# Patient Record
Sex: Female | Born: 1990 | Race: White | Hispanic: Yes | Marital: Single | Smoking: Never smoker
Health system: Southern US, Community
[De-identification: ages and names within clinical notes are randomized; demographics above are authoritative.]

## PROBLEM LIST (undated history)

## (undated) DIAGNOSIS — F419 Anxiety disorder, unspecified: Secondary | ICD-10-CM

## (undated) HISTORY — DX: Anxiety disorder, unspecified: F41.9

---

## 2012-03-12 ENCOUNTER — Telehealth: Payer: Self-pay

## 2012-03-23 NOTE — Telephone Encounter (Signed)
done

## 2012-05-22 ENCOUNTER — Ambulatory Visit: Payer: Self-pay | Admitting: Physician Assistant

## 2012-05-22 VITALS — BP 111/76 | HR 96 | Temp 98.5°F | Resp 16 | Ht 61.5 in | Wt 96.8 lb

## 2012-05-22 DIAGNOSIS — N390 Urinary tract infection, site not specified: Secondary | ICD-10-CM

## 2012-05-22 DIAGNOSIS — R109 Unspecified abdominal pain: Secondary | ICD-10-CM

## 2012-05-22 DIAGNOSIS — B373 Candidiasis of vulva and vagina: Secondary | ICD-10-CM

## 2012-05-22 DIAGNOSIS — R350 Frequency of micturition: Secondary | ICD-10-CM

## 2012-05-22 LAB — POCT UA - MICROSCOPIC ONLY
Casts, Ur, LPF, POC: NEGATIVE
Crystals, Ur, HPF, POC: NEGATIVE
Yeast, UA: NEGATIVE

## 2012-05-22 LAB — POCT URINALYSIS DIPSTICK
Glucose, UA: NEGATIVE
Nitrite, UA: NEGATIVE
Protein, UA: 100
Spec Grav, UA: >=1.03
Urobilinogen, UA: 0.2
pH, UA: 5.5

## 2012-05-22 MED ORDER — PHENAZOPYRIDINE HCL 200 MG PO TABS: 200.0000 mg | ORAL_TABLET | Freq: Three times a day (TID) | ORAL | Status: DC | PRN

## 2012-05-22 MED ORDER — FLUCONAZOLE 150 MG PO TABS: 150.0000 mg | ORAL_TABLET | Freq: Once | ORAL | Status: DC

## 2012-05-22 MED ORDER — NITROFURANTOIN MONOHYD MACRO 100 MG PO CAPS: 100.0000 mg | ORAL_CAPSULE | Freq: Two times a day (BID) | ORAL | Status: DC

## 2012-05-22 NOTE — Progress Notes (Signed)
Subjective:    Patient ID: Krystal Moreno, female    DOB: [birth year], 21 y.o.   MRN: [id number]  HPI   Krystal Moreno is a 21 yr old female here because "it hurts when I pee".  Did note some blood in her urine, but wasn't sure if this could be here period starting.  Has had some lower abdominal pain, but again wasn't sure if this was due to her period coming.  Denies nausea, vomiting, fever, or chills.  No back pain.  No vaginal discharge.  No concern for STIs, was tested recently.  Does not remember when her LMP was but is pretty sure it was sometime in November, thinks next period is due to start 12/13    Review of Systems  Constitutional: Negative for fever and chills.  HENT: Negative.   Respiratory: Negative.   Cardiovascular: Negative.   Gastrointestinal: Positive for abdominal pain.  Genitourinary: Positive for dysuria, frequency and hematuria. Negative for vaginal bleeding and vaginal discharge.  Musculoskeletal: Negative.   Neurological: Negative.        Objective:   Physical Exam  Vitals reviewed. Constitutional: She is oriented to person, place, and time. She appears well-developed and well-nourished. No distress.  HENT:  Head: Normocephalic and atraumatic.  Cardiovascular: Normal rate, regular rhythm and S1 normal.  Exam reveals no gallop and no friction rub.   No murmur heard.      Split S2  Pulmonary/Chest: Effort normal and breath sounds normal. She has no wheezes. She has no rhonchi. She has no rales.  Abdominal: Soft. Bowel sounds are normal. There is tenderness in the suprapubic area. There is no CVA tenderness.  Neurological: She is alert and oriented to person, place, and time.  Skin: Skin is warm and dry.  Psychiatric: She has a normal mood and affect. Her behavior is normal.      Filed Vitals:   05/22/12 1143  BP: 111/76  Pulse: 96  Temp: 98.5 F (36.9 C)  Resp: 16     Results for orders placed in visit on 05/22/12  POCT UA - MICROSCOPIC  ONLY      Component Value Range   WBC, Ur, HPF, POC TNTC     RBC, urine, microscopic 10-15     Bacteria, U Microscopic 1+     Mucus, UA 1+     Epithelial cells, urine per micros 2-4     Crystals, Ur, HPF, POC neg     Casts, Ur, LPF, POC neg     Yeast, UA neg    POCT URINALYSIS DIPSTICK      Component Value Range   Color, UA yellow     Clarity, UA cloudy     Glucose, UA neg     Bilirubin, UA neg     Ketones, UA neg     Spec Grav, UA >=1.030     Blood, UA large     pH, UA 5.5     Protein, UA 100     Urobilinogen, UA 0.2     Nitrite, UA neg     Leukocytes, UA moderate (2+)    POCT URINE PREGNANCY      Component Value Range   Preg Test, Ur Negative         Assessment & Plan:   1. UTI (lower urinary tract infection)  nitrofurantoin, macrocrystal-monohydrate, (MACROBID) 100 MG capsule, phenazopyridine (PYRIDIUM) 200 MG tablet, Urine culture  2. Urinary frequency  POCT UA - Microscopic Only, POCT urinalysis dipstick, POCT urine  pregnancy  3. Abdominal pain  POCT UA - Microscopic Only, POCT urinalysis dipstick, POCT urine pregnancy  4. Vaginal candidiasis  fluconazole (DIFLUCAN) 150 MG tablet    Krystal Moreno is a 21 yr old female with UTI.  Will treat with Macrobid x 5 days and send urine culture.  Pyridium for symptoms.  One dose given here in the office.  Encouraged plenty of water.  Pt will RTC if worsening or not improving.    Physiologically split S2 noted on exam.  Pt is asymptomatic.  Ryan Dunn PA-C auscultated patient's heart as well.

## 2012-05-22 NOTE — Patient Instructions (Addendum)
Take the antibiotic as directed.  Be sure to finish the full course.  You may use pyridium up to 3 times per day for symptoms.  Drink plenty of water.  Let us know if you are worsening or not improving.  I am sending your urine out for a culture as well, I will let you know if we need to make changes to your medication when this is back.  Urinary Tract Infection Urinary tract infections (UTIs) can develop anywhere along your urinary tract. Your urinary tract is your body's drainage system for removing wastes and extra water. Your urinary tract includes two kidneys, two ureters, a bladder, and a urethra. Your kidneys are a pair of bean-shaped organs. Each kidney is about the size of your fist. They are located below your ribs, one on each side of your spine. CAUSES Infections are caused by microbes, which are microscopic organisms, including fungi, viruses, and bacteria. These organisms are so small that they can only be seen through a microscope. Bacteria are the microbes that most commonly cause UTIs. SYMPTOMS  Symptoms of UTIs may vary by age and gender of the patient and by the location of the infection. Symptoms in young women typically include a frequent and intense urge to urinate and a painful, burning feeling in the bladder or urethra during urination. Older women and men are more likely to be tired, shaky, and weak and have muscle aches and abdominal pain. A fever may mean the infection is in your kidneys. Other symptoms of a kidney infection include pain in your back or sides below the ribs, nausea, and vomiting. DIAGNOSIS To diagnose a UTI, your caregiver will ask you about your symptoms. Your caregiver also will ask to provide a urine sample. The urine sample will be tested for bacteria and white blood cells. White blood cells are made by your body to help fight infection. TREATMENT  Typically, UTIs can be treated with medication. Because most UTIs are caused by a bacterial infection, they  usually can be treated with the use of antibiotics. The choice of antibiotic and length of treatment depend on your symptoms and the type of bacteria causing your infection. HOME CARE INSTRUCTIONS  If you were prescribed antibiotics, take them exactly as your caregiver instructs you. Finish the medication even if you feel better after you have only taken some of the medication.  Drink enough water and fluids to keep your urine clear or pale yellow.  Avoid caffeine, tea, and carbonated beverages. They tend to irritate your bladder.  Empty your bladder often. Avoid holding urine for long periods of time.  Empty your bladder before and after sexual intercourse.  After a bowel movement, women should cleanse from front to back. Use each tissue only once. SEEK MEDICAL CARE IF:   You have back pain.  You develop a fever.  Your symptoms do not begin to resolve within 3 days. SEEK IMMEDIATE MEDICAL CARE IF:   You have severe back pain or lower abdominal pain.  You develop chills.  You have nausea or vomiting.  You have continued burning or discomfort with urination. MAKE SURE YOU:   Understand these instructions.  Will watch your condition.  Will get help right away if you are not doing well or get worse. Document Released: 03/09/2005 Document Revised: 11/29/2011 Document Reviewed: 07/08/2011 [facility] Patient Information 2013 [place], [place].

## 2012-05-24 LAB — URINE CULTURE: Colony Count: 9000

## 2013-01-31 ENCOUNTER — Ambulatory Visit: Payer: Self-pay | Admitting: Physician Assistant

## 2013-01-31 VITALS — BP 118/74 | HR 87 | Temp 98.0°F | Resp 17 | Ht 61.5 in | Wt 105.0 lb

## 2013-01-31 DIAGNOSIS — N898 Other specified noninflammatory disorders of vagina: Secondary | ICD-10-CM

## 2013-01-31 DIAGNOSIS — R350 Frequency of micturition: Secondary | ICD-10-CM

## 2013-01-31 DIAGNOSIS — N390 Urinary tract infection, site not specified: Secondary | ICD-10-CM

## 2013-01-31 LAB — POCT UA - MICROSCOPIC ONLY
Casts, Ur, LPF, POC: NEGATIVE
Crystals, Ur, HPF, POC: NEGATIVE
Mucus, UA: POSITIVE
Yeast, UA: NEGATIVE

## 2013-01-31 LAB — POCT URINALYSIS DIPSTICK
Bilirubin, UA: NEGATIVE
Glucose, UA: NEGATIVE
Ketones, UA: NEGATIVE
Nitrite, UA: NEGATIVE
Protein, UA: 30
Spec Grav, UA: 1.025
Urobilinogen, UA: 0.2
pH, UA: 7.5

## 2013-01-31 LAB — POCT WET PREP WITH KOH
KOH Prep POC: NEGATIVE
Trichomonas, UA: NEGATIVE
Yeast Wet Prep HPF POC: NEGATIVE

## 2013-01-31 MED ORDER — CIPROFLOXACIN HCL 500 MG PO TABS: 500.0000 mg | ORAL_TABLET | Freq: Two times a day (BID) | ORAL | Status: DC

## 2013-01-31 NOTE — Progress Notes (Signed)
Patient ID: Krystal Moreno MRN: [id number], DOB: [birth year], 22 y.o. Date of Encounter: 01/31/2013, 12:38 PM  Primary Physician: No PCP Per Patient  Chief Complaint: urinary frequency and dysuria  HPI: 22 y.o. year old female with presents with 6 day history of urinary frequency, dysuria, and suprapubic pressure. Denies flank pain, nausea, vomiting, fever, chills, vaginal discharge, or odor. Has noticed some vaginal pruritis that she thinks could be a yeast infection. She had several days of antibiotic 2 weeks ago for a sore throat.  Has tried 1 dose of Azo. LNMP 01/30/13. She has no concerns about STI's and does not wish to be tested today.   Patient is otherwise doing well without issues or complaints.  Past Medical History  Diagnosis Date  . Anxiety      Home Meds: Prior to Admission medications   Not on File    Allergies: No Known Allergies  History   Social History  . Marital Status: Single    Spouse Name: N/A    Number of Children: N/A  . Years of Education: N/A   Occupational History  . Not on file.   Social History Main Topics  . Smoking status: Never Smoker   . Smokeless tobacco: Not on file  . Alcohol Use: No  . Drug Use: No  . Sexual Activity: Yes    Birth Control/ Protection: None   Other Topics Concern  . Not on file   Social History Narrative  . No narrative on file     Review of Systems: Constitutional: negative for chills, fever, night sweats, weight changes, or fatigue  HEENT: negative for vision changes, hearing loss, congestion, rhinorrhea, ST, epistaxis, or sinus pressure Cardiovascular: negative for chest pain or palpitations Respiratory: negative for cough, hemoptysis, wheezing, shortness of breath. Abdominal: positive for suprapubic abdominal pain,   No nausea, vomiting, diarrhea, or constipation Genitourinary: positive for urinary frequency and dysuria. Negative for vaginal discharge, odor, or pelvic pain.  Dermatological: negative  for rashes. Neurologic: negative for headache, dizziness, or syncope   Physical Exam: Blood pressure 118/74, pulse 87, temperature 98 F (36.7 C), temperature source Oral, resp. rate 17, height 5' 1.5" (1.562 m), weight 105 lb (47.628 kg), last menstrual period 01/30/2013, SpO2 98.00%., Body mass index is 19.52 kg/(m^2). General: Well developed, well nourished, in no acute distress. Head: Normocephalic, atraumatic, eyes without discharge, sclera non-icteric, nares are without discharge. External ear normal in appearance. Neck: Supple. No thyromegaly. Full ROM. No lymphadenopathy. Lungs: Clear bilaterally to auscultation without wheezes, rales, or rhonchi. Breathing is unlabored. Heart: RRR with S1 S2. No murmurs, rubs, or gallops appreciated. Abdominal: +BS x 4. No hepatosplenomegaly, rebound tenderness, or guarding. Positive suprapubic tenderness. No CVA tenderness bilaterally.  Msk:  Strength and tone normal for age. Extremities/Skin: Warm and dry. No clubbing or cyanosis. No edema.  Neuro: Alert and oriented X 3. Moves all extremities spontaneously. Gait is normal. CNII-XII grossly in tact. Psych:  Responds to questions appropriately with a normal affect.   Labs: Results for orders placed in visit on 01/31/13  POCT UA - MICROSCOPIC ONLY      Result Value Range   WBC, Ur, HPF, POC TNTC     RBC, urine, microscopic 6-8     Bacteria, U Microscopic 2+     Mucus, UA pos     Epithelial cells, urine per micros 3-5     Crystals, Ur, HPF, POC neg     Casts, Ur, LPF, POC neg     Yeast,  UA neg    POCT URINALYSIS DIPSTICK      Result Value Range   Color, UA yellow     Clarity, UA cloudy     Glucose, UA neg     Bilirubin, UA neg     Ketones, UA neg     Spec Grav, UA 1.025     Blood, UA moderate     pH, UA 7.5     Protein, UA 30     Urobilinogen, UA 0.2     Nitrite, UA neg     Leukocytes, UA large (3+)    POCT WET PREP WITH KOH      Result Value Range   Trichomonas, UA Negative      Clue Cells Wet Prep HPF POC 2-4     Epithelial Wet Prep HPF POC 4-8     Yeast Wet Prep HPF POC neg     Bacteria Wet Prep HPF POC 1+     RBC Wet Prep HPF POC 4-6     WBC Wet Prep HPF POC 3-8     KOH Prep POC Negative       ASSESSMENT AND PLAN:  22 y.o. year old female with urinary tract infection Urine culture sent Increase fluids Cipro 500 mg bid x 5 days Azo as needed for symptomatic relief Follow up if symptoms worsen or fail to improve.  Grier Mitts, PA-C 01/31/2013 12:38 PM

## 2013-09-04 ENCOUNTER — Ambulatory Visit: Payer: Self-pay | Admitting: Physician Assistant

## 2013-09-04 VITALS — BP 100/76 | HR 78 | Temp 98.1°F | Resp 18 | Ht 62.0 in | Wt 105.6 lb

## 2013-09-04 DIAGNOSIS — K5900 Constipation, unspecified: Secondary | ICD-10-CM

## 2013-09-04 DIAGNOSIS — R300 Dysuria: Secondary | ICD-10-CM

## 2013-09-04 DIAGNOSIS — R350 Frequency of micturition: Secondary | ICD-10-CM

## 2013-09-04 DIAGNOSIS — N390 Urinary tract infection, site not specified: Secondary | ICD-10-CM

## 2013-09-04 LAB — POCT URINALYSIS DIPSTICK
Bilirubin, UA: NEGATIVE
Glucose, UA: NEGATIVE
Ketones, UA: NEGATIVE
Nitrite, UA: NEGATIVE
PH UA: 5.5
PROTEIN UA: NEGATIVE
Spec Grav, UA: >=1.03
UROBILINOGEN UA: 0.2

## 2013-09-04 LAB — POCT UA - MICROSCOPIC ONLY
CASTS, UR, LPF, POC: NEGATIVE
Crystals, Ur, HPF, POC: NEGATIVE
Mucus, UA: POSITIVE
YEAST UA: NEGATIVE

## 2013-09-04 LAB — POCT URINE PREGNANCY: Preg Test, Ur: NEGATIVE

## 2013-09-04 MED ORDER — NITROFURANTOIN MONOHYD MACRO 100 MG PO CAPS: 100.0000 mg | ORAL_CAPSULE | Freq: Two times a day (BID) | ORAL | Status: DC

## 2013-09-04 NOTE — Patient Instructions (Signed)
-  Take a capfull of Miralax daily for your bowels with plenty of water.  -Take your antibiotic twice daily until done. -Drink plenty of water. -Come back in if your period has not come on in a week.  Urinary Tract Infection Urinary tract infections (UTIs) can develop anywhere along your urinary tract. Your urinary tract is your body's drainage system for removing wastes and extra water. Your urinary tract includes two kidneys, two ureters, a bladder, and a urethra. Your kidneys are a pair of bean-shaped organs. Each kidney is about the size of your fist. They are located below your ribs, one on each side of your spine. CAUSES Infections are caused by microbes, which are microscopic organisms, including fungi, viruses, and bacteria. These organisms are so small that they can only be seen through a microscope. Bacteria are the microbes that most commonly cause UTIs. SYMPTOMS  Symptoms of UTIs may vary by age and gender of the patient and by the location of the infection. Symptoms in young women typically include a frequent and intense urge to urinate and a painful, burning feeling in the bladder or urethra during urination. Older women and men are more likely to be tired, shaky, and weak and have muscle aches and abdominal pain. A fever may mean the infection is in your kidneys. Other symptoms of a kidney infection include pain in your back or sides below the ribs, nausea, and vomiting. DIAGNOSIS To diagnose a UTI, your caregiver will ask you about your symptoms. Your caregiver also will ask to provide a urine sample. The urine sample will be tested for bacteria and white blood cells. White blood cells are made by your body to help fight infection. TREATMENT  Typically, UTIs can be treated with medication. Because most UTIs are caused by a bacterial infection, they usually can be treated with the use of antibiotics. The choice of antibiotic and length of treatment depend on your symptoms and the type of  bacteria causing your infection. HOME CARE INSTRUCTIONS  If you were prescribed antibiotics, take them exactly as your caregiver instructs you. Finish the medication even if you feel better after you have only taken some of the medication.  Drink enough water and fluids to keep your urine clear or pale yellow.  Avoid caffeine, tea, and carbonated beverages. They tend to irritate your bladder.  Empty your bladder often. Avoid holding urine for long periods of time.  Empty your bladder before and after sexual intercourse.  After a bowel movement, women should cleanse from front to back. Use each tissue only once. SEEK MEDICAL CARE IF:   You have back pain.  You develop a fever.  Your symptoms do not begin to resolve within 3 days. SEEK IMMEDIATE MEDICAL CARE IF:   You have severe back pain or lower abdominal pain.  You develop chills.  You have nausea or vomiting.  You have continued burning or discomfort with urination. MAKE SURE YOU:   Understand these instructions.  Will watch your condition.  Will get help right away if you are not doing well or get worse. Document Released: 03/09/2005 Document Revised: 11/29/2011 Document Reviewed: 07/08/2011 [facility] Patient Information 2014 [place], [place].

## 2013-09-04 NOTE — Progress Notes (Signed)
Subjective:    Patient ID: Krystal ShieldsAna Scholz-Montoya, female    DOB: 09/25/1990, 23 y.o.   MRN: [id number]  HPI Primary Physician: No PCP Per Patient  Chief Complaint: Dysuria, urinary frequency and urinary urgency x 1 week   HPI: 23 y.o. female with history below presents with a 1 week history of dysuria, urinary frequency, and urinary urgency. Afebrile. No chills. No abdominal pain, but some cramping. No flank pain or LBP. Some nausea, no vomiting.  1 day late for her period. This can be usual for her though. Having some cramping. Always cramps before her period. Does have some nausea associated with her period onset. Currently sexually active. Uses condoms, but not everytime. Took Plan B last month. No history of STDs. In current relationship for 3 years.   Will go 1-2 days without BM. Does not drink much water or each much fiber.    Past Medical History  Diagnosis Date  . Anxiety      Home Meds: Prior to Admission medications   Not on File    Allergies: No Known Allergies  History   Social History  . Marital Status: Single    Spouse Name: N/A    Number of Children: N/A  . Years of Education: N/A   Occupational History  . Not on file.   Social History Main Topics  . Smoking status: Never Smoker   . Smokeless tobacco: Not on file  . Alcohol Use: No  . Drug Use: No  . Sexual Activity: Yes    Birth Control/ Protection: None   Other Topics Concern  . Not on file   Social History Narrative  . No narrative on file     Review of Systems  Constitutional: Negative for fever, chills and fatigue.  Gastrointestinal: Positive for nausea. Negative for vomiting.  Genitourinary: Positive for dysuria, urgency and frequency. Negative for hematuria, flank pain, decreased urine volume, vaginal bleeding, vaginal discharge, enuresis, difficulty urinating, genital sores, vaginal pain, menstrual problem, pelvic pain and dyspareunia.  Musculoskeletal: Positive for back pain. Negative for  myalgias.       Also associated with period at baseline.   Skin: Negative for rash.  Psychiatric/Behavioral: Negative for hallucinations, behavioral problems, confusion, dysphoric mood, decreased concentration and agitation. The patient is not nervous/anxious and is not hyperactive.        Objective:   Physical Exam  Physical Exam: Blood pressure 100/76, pulse 78, temperature 98.1 F (36.7 C), temperature source Oral, resp. rate 18, height 5\' 2"  (1.575 m), weight 105 lb 9.6 oz (47.9 kg), last menstrual period 08/06/2013, SpO2 99.00%., Body mass index is 19.31 kg/(m^2). General: Well developed, well nourished, in no acute distress. Head: Normocephalic, atraumatic, eyes without discharge, sclera non-icteric, nares are without discharge. Bilateral auditory canals clear, TM's are without perforation, pearly grey and translucent with reflective cone of light bilaterally. Oral cavity moist, posterior pharynx without exudate, erythema, peritonsillar abscess, or post nasal drip. Uvula midline.   Neck: Supple. No thyromegaly. Full ROM. No lymphadenopathy. No nuchal rigidity.  Lungs: Clear bilaterally to auscultation without wheezes, rales, or rhonchi. Breathing is unlabored. Heart: RRR with S1 S2. No murmurs, rubs, or gallops appreciated. Abdomen: Soft, non-tender, non-distended with normoactive bowel sounds. Palpation over the bladder creates sensation to void. No hepatosplenomegaly. No rebound/guarding. No obvious abdominal masses. No CVA TTP bilaterally.  Msk:  Strength and tone normal for age. Extremities/Skin: Warm and dry. No clubbing or cyanosis. No edema. No rashes or suspicious lesions. Neuro: Alert and oriented  X 3. Moves all extremities spontaneously. Gait is normal. CNII-XII grossly in tact. Psych:  Responds to questions appropriately with a normal affect.   Labs: Results for orders placed in visit on 09/04/13  POCT URINALYSIS DIPSTICK      Result Value Ref Range   Color, UA yellow       Clarity, UA cloudy     Glucose, UA neg     Bilirubin, UA neg     Ketones, UA neg     Spec Grav, UA >=1.030     Blood, UA small     pH, UA 5.5     Protein, UA neg     Urobilinogen, UA 0.2     Nitrite, UA neg     Leukocytes, UA small (1+)    POCT UA - MICROSCOPIC ONLY      Result Value Ref Range   WBC, Ur, HPF, POC TNTC     RBC, urine, microscopic 3-5     Bacteria, U Microscopic trace     Mucus, UA positive     Epithelial cells, urine per micros TNTC     Crystals, Ur, HPF, POC neg     Casts, Ur, LPF, POC neg     Yeast, UA neg    POCT URINE PREGNANCY      Result Value Ref Range   Preg Test, Ur Negative      Urine culture pending     Assessment & Plan:  23 year old female with UTI and constipation  1) UTI -Macrobid 100 mg 1 po bid #14 no RF -Push fluids -Await urine culture results -If period does not come on within the next week RTC -RTC precautions  2) Constipation -Miralax -Increase fiber -Increase water   Eula Listen, MHS, PA-C [facility] [address] [place], [place] [address] [phone] [facility] 09/04/2013 11:37 AM

## 2013-09-06 LAB — URINE CULTURE

## 2014-04-17 ENCOUNTER — Emergency Department (HOSPITAL_COMMUNITY)
Admission: EM | Admit: 2014-04-17 | Discharge: 2014-04-17 | Disposition: A | Discharge status: Home / Self Care | Payer: No Typology Code available for payment source | Attending: Emergency Medicine | Admitting: Emergency Medicine

## 2014-04-17 ENCOUNTER — Emergency Department (HOSPITAL_COMMUNITY): Payer: No Typology Code available for payment source

## 2014-04-17 ENCOUNTER — Encounter (HOSPITAL_COMMUNITY): Payer: Self-pay | Admitting: Emergency Medicine

## 2014-04-17 DIAGNOSIS — S3992XA Unspecified injury of lower back, initial encounter: Secondary | ICD-10-CM | POA: Insufficient documentation

## 2014-04-17 DIAGNOSIS — Z792 Long term (current) use of antibiotics: Secondary | ICD-10-CM | POA: Diagnosis not present

## 2014-04-17 DIAGNOSIS — Z8659 Personal history of other mental and behavioral disorders: Secondary | ICD-10-CM | POA: Insufficient documentation

## 2014-04-17 DIAGNOSIS — Y9389 Activity, other specified: Secondary | ICD-10-CM | POA: Diagnosis not present

## 2014-04-17 DIAGNOSIS — Y92410 Unspecified street and highway as the place of occurrence of the external cause: Secondary | ICD-10-CM | POA: Insufficient documentation

## 2014-04-17 DIAGNOSIS — M5489 Other dorsalgia: Secondary | ICD-10-CM

## 2014-04-17 MED ORDER — IBUPROFEN 800 MG PO TABS
800.0000 mg | ORAL_TABLET | Freq: Once | ORAL | Status: AC
  Administered 2014-04-17: 800 mg via ORAL
  Filled 2014-04-17: qty 1

## 2014-04-17 MED ORDER — IBUPROFEN 800 MG PO TABS: 800.0000 mg | ORAL_TABLET | Freq: Three times a day (TID) | ORAL | Status: DC

## 2014-04-17 NOTE — Discharge Instructions (Signed)
Ejercicios para la espalda (Back Exercises) Estos ejercicios lo ayudarn al comienzo de la rehabilitacin. Los sntomas podrn aliviarse con o sin asistencia adicional de su mdico, fisioterapeuta o Herbalistentrenador. Al completar estos ejercicios, recuerde:   Restaurar la flexibilidad del tejido ayuda a que las articulaciones recuperen el movimiento normal. Esto permite que el movimiento y la actividad sean ms saludables y menos dolorosos.  Para que sea efectiva, cada elongacin debe realizarse durante al menos 30 segundos.  La elongacin nunca debe ser dolorosa. Deber sentir slo un alargamiento o distensin suave del tejido que estira. ELONGACIN Extensin posicin prona United Stationerssobre los codos  Acustese boca abajo sobre el piso, una cama ser muy blanda. Coloque las palmas a una distancia igual al ancho de los hombros y a la altura de la cabeza.  Coloque los codos bajo los hombros. Si siente dolor, colquese almohadas debajo del pecho.  Deje que su cuerpo se relaje, de modo que las caderas queden ms abajo y tengan ms contacto con el piso.  Mantenga esta posicin durante __________ segundos.  Vuelva lentamente a la posicin plana sobre el piso. Reptalo __________ veces. Realice este ejercicio __________ veces por da.  AMPLITUD DE MOVIMIENTOS - Extensin - flexin de brazos en posicin prona  Acustese boca abajo Charles Schwabsobre el piso, una cama ser Otter Lakemuy blanda. Coloque las palmas a una distancia igual al ancho de los hombros y a la altura de la cabeza.  Mantenga la espalda tan relajada como pueda, enderece lentamente los codos [address] Flo[address] mantiene las caderas contra el suelo. Puede modificar la posicin de las manos para estar ms cmodo. A medida que gana movimiento, sus manos quedarn ms por debajo de los hombros.  Mantenga cada posicin durante __________ segundos.  Vuelva lentamente a la posicin plana sobre el piso. Reptalo __________ veces. Realice este ejercicio __________ veces por da.    AMPLITUD DE MOVIMIENTOS Cuadrpedo [place] vertebral neutral  Coloque las manos y las rodillas en una superficie firme. Las manos deben quedar a la altura de los hombros y las rodillas debajo de las caderas. Puede colocar algo debajo las rodillas para estar ms cmodo.  Deje caer la cabeza y apunte el cccix hacia el piso. De este modo se redondear la cintura, en Clif[place] Gardensuna postura similar a un gato enojado. Mantenga esta posicin durante __________ segundos.  Levante lentamente la cabeza y relaje el cccix, de modo que su espalda forme un gran arco, como si fuera un caballo viejo.  Mantenga esta posicin durante __________ segundos.  Reptalo hasta sentir calor en la cintura.  Ahora encuentre su "punto ideal". Ser la posicin ms cmoda Dollar Generalentre las dos posiciones anteriores. En esta posicin es cuando su columna est neutral. Una vez que encuentre la posicin, tensione los msculos del estmago para sostener la zona inferior de la espalda.  Mantenga esta posicin durante __________ segundos. Reptalo __________ veces. Realice este ejercicio __________ veces por da.  ELONGACION Flexin - una rodilla al pecho   Recustese en una cama dura o sobre el piso, con ambas piernas extendidas al frente.  Manteniendo una pierna en contacto con el piso, lleve la rodilla opuesta al pecho. Mantenga la pierna en esa posicin, sostenindola por la zona posterior del muslo o por la rodilla.  Presione hasta sentir un suave estiramiento en la cintura. Mantenga esta posicin durante __________ segundos.  Libere la pierna lentamente y repita el ejercicio con el lado opuesto. Reptalo __________ veces. Realice este ejercicio __________ veces por da.  ELONGACIN Tendones de pie  OwingsvilleEstando de  pie o sentado, extienda la pierna derecha / izquierdo, colocando el pie en una silla o banco.  Doble ligeramente la espalda y las caderas, formando un arco suave hacia adelante.  Lleve el pecho hacia adelante hasta sentir un  ligero estiramiento en la zona posterior de la rodilla o muslo derecha / izquierdo. (Si se realiza correctamente, este ejercicio requiere inclinarse muy poco hacia adelante).  Mantenga esta posicin durante __________ segundos. Reptalo __________ veces. Realice este estiramiento __________ Anthoney Harada por da. FORTALECIMIENTO - Abdominales profundos - Inclinacin plvica  Recustese sobre una cama firme o sobre el suelo. Mantenga las piernas al frente, doble las rodillas de modo que ambas apunten hacia el techo y los pies queden bien apoyados en el piso.  Tensione la zona baja de los msculos abdominales para presionar la Oncologist. Este movimiento har rotar su pelvis de modo que el cccix quede hacia arriba y no apuntando a los pies o hacia el piso.  Con una tensin suave y respiracin pareja, mantenga esta posicin durante __________ segundos. Reptalo __________ veces. Realice este ejercicio __________ veces por da.  FORTALECIMIENTO Abdominales encogimiento abdominal.  Recustese sobre una cama firme o sobre el suelo. Mantenga las piernas al frente, doble las rodillas de modo que ambas apunten hacia el techo y los pies queden bien apoyados en el piso. Cruce las Google.  Apunte suavemente con la barbilla hacia abajo, sin doblar el cuello.  Tensione los abdominales y eleve lentamente el tronco la altura suficiente para despegar los omplatos. Si se eleva ms, pondr tensin excesiva en la cintura y esto no fortalecer ms los abdominales.  Controle la vuelta a la posicin inicial. Reptalo __________ veces. Realice este ejercicio __________ veces por da.  EN CUATRO MIEMBROS - Elevacin de miembro superior e inferior opuestos  [employer] y las rodillas en una superficie firme. Las manos deben quedar a la altura de los hombros y las rodillas debajo de las caderas. Puede colocar algo debajo las rodillas para estar ms cmodo.  Encuentre la posicin neutral de  la columna vertebral y Public house manager los msculos abdominales de modo que pueda mantener esta posicin. Los hombros y las caderas deben formar un rectngulo paralelo con el suelo y sin curvas.  Manteniendo el tronco firme, eleve la mano derecha a la altura del hombro y luego eleve la pierna izquierda a la altura de la cadera. Asegrese de no contener la respiracin. Mantenga cada posicin durante __________ segundos.  Con los msculos abdominales en tensin y la espalda firme, vuelva lentamente a la posicin inicial. Repita con el otro brazo y la otra pierna. Reptalo __________ veces. Realice este ejercicio __________ veces por da. Document Released: 11/21/2005 Document Revised: 08/22/2011 [facility] Patient Information 2015 [place], [place]. This information is not intended to replace advice given to you by your health care provider. Make sure you discuss any questions you have with your health care provider.  Dolor de espalda en el adulto (Back Pain, Adult)  El dolor de cintura es frecuente. Aproximadamente 1 de cada 5 personas lo sufren.La causa rara vez pone en peligro la vida. Con frecuencia mejora luego de algn tiempo.Alrededor de la mitad de las personas que sufren un inicio sbito de dolor de cintura, se sentirn mejor luego de 2 semanas. Aproximadamente 8 de cada 10 se sentirn mejor luego de 6 semanas.  CAUSAS  Algunas causas comunes son:   Distensin de los msculos o ligamentos que sostienen la columna vertebral.  Desgaste (degeneracin)  de los discos vertebrales.  Artritis.  Traumatismos directos en la espalda. DIAGNSTICO  La mayor parte de las veces, la causa directa no se conoce.Sin embargo, Chief Technology Officerel dolor puede tratarse efectivamente an cuando no se Best boyconozca la causa.Una de las formas ms precisas de asegurar que la causa del dolor no constituye un peligro es responder a las preguntas del mdico acerca de su salud y sus sntomas. Si el mdico necesita ms informacin,  podr indicar anlisis de laboratorio o Education officer, environmentalrealizar un diagnstico por imgenes (radiografas o Health visitorresonancia magntica).Sin embargo, aunque las Hovnanian Enterprisesimgenes muestren modificaciones, generalmente no es necesaria la Azerbaijanciruga.  INSTRUCCIONES PARA EL CUIDADO EN EL HOGAR  En algunas personas, el dolor de espalda vuelve.Como rara vez es peligroso, los pacientes pueden aprender a Interior and spatial designermanejarlo ellos mismos.   Mantngase activo. Si permanece sentado o de pie mucho tiempo en el mismo lugar, se tensiona la espalda.  No se siente, maneje ni se quede parado en un mismo lugar por ms de 30 minutos. Realice caminatas cortas en superficies planas ni bien el dolor haya cedido. Trate de Copyaumentar cada da el tiempo que camina .  No se quede en la cama.Si hace reposo durante ms de 1 o 2 das, puede Estate agentretrasar la recuperacin.  No evite los ejercicios ni el trabajo.El cuerpo est hecho para moverse.No es peligroso estar [place]activo, aunque le duela la espalda.La espalda se curar ms rpido si contina sus actividades antes de que el dolor se vaya.  Preste atencin a su cuerpo cuando se incline y se levante. Muchas personas sienten menos molestias cuando levantan objetos si doblan las rodillas, mantienen la carga cerca del cuerpo y evitan torcerse. Generalmente, las posiciones ms cmodas son las que ejercen menos tensin en la espalda en recuperacin.  Encuentre una posicin cmoda para dormir. Utilice un colchn firme y recustese de Palcocostado. Doble ligeramente sus rodillas. Si se recuesta sobre su espalda, coloque una almohada debajo de sus rodillas.  Tome slo medicamentos de venta libre o recetados, segn las indicaciones del mdico. Los medicamentos de venta libre para Primary school teachercalmar el dolor y reducir Futures traderla inflamacin, son los que en general ms ayudan.El mdico podr prescribirle relajantes musculares.Estos medicamentos calman el dolor de modo que pueda retornar a sus actividades normales y a Investment banker, operationalrealizar ejercicios saludables.  Aplique  hielo sobre la zona lesionada.  Ponga el hielo en una bolsa plstica.  Colquese una toalla entre la piel y la bolsa de hielo.  Deje la bolsa de hielo durante 15 a 20 minutos 3 a 4 veces por da, durante los primeros 2  3 das. Luego podr alternar Eusebio Meentre calor y hielo para reducir Chief Technology Officerel dolor y los espasmos.  Consulte a su mdico si puede tratar de hacer ejercicios para la espalda y recibir un masaje suave. Pueden ser beneficiosos.  Evite sentirse ansioso o estresado.El estrs aumenta la tensin muscular y puede empeorar el dolor de espalda.Es importante reconocer cuando est ansioso o estresado y aprender la forma de controlarlos.El ejercicio es una gran opcin. SOLICITE ATENCIN MDICA SI:   Siente un dolor que no se alivia con reposo o medicamentos.  El dolor no mejora en 1 semana.  Desarrolla nuevos sntomas.  No se siente bien en general. SOLICITE ATENCIN MDICA DE INMEDIATO SI:  Siente un dolor que se irradia desde la espalda hacia sus piernas.  Desarrolla nuevos problemas en el intestino o la vejiga.  Siente debilidad o adormecimiento inusual en sus brazos o piernas.  Presenta nuseas o vmitos.  Presenta dolor abdominal.  Se siente  desfalleciente. Document Released: 05/30/2005 Document Revised: 11/29/2011 [facility] Patient Information 2015 [place], [place]. This information is not intended to replace advice given to you by your health care provider. Make sure you discuss any questions you have with your health care provider.
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
## 2014-06-02 ENCOUNTER — Ambulatory Visit (INDEPENDENT_AMBULATORY_CARE_PROVIDER_SITE_OTHER): Payer: Self-pay | Admitting: Family Medicine

## 2014-06-02 ENCOUNTER — Encounter: Payer: Self-pay | Admitting: Family Medicine

## 2014-06-02 VITALS — BP 116/74 | HR 74 | Temp 99.1°F | Resp 16 | Ht 62.0 in | Wt 103.6 lb

## 2014-06-02 DIAGNOSIS — R300 Dysuria: Secondary | ICD-10-CM

## 2014-06-02 DIAGNOSIS — R350 Frequency of micturition: Secondary | ICD-10-CM

## 2014-06-02 LAB — POCT UA - MICROSCOPIC ONLY
CRYSTALS, UR, HPF, POC: NEGATIVE
Casts, Ur, LPF, POC: NEGATIVE
Mucus, UA: POSITIVE
YEAST UA: NEGATIVE

## 2014-06-02 LAB — POCT URINALYSIS DIPSTICK
BILIRUBIN UA: NEGATIVE
Glucose, UA: NEGATIVE
LEUKOCYTES UA: NEGATIVE
NITRITE UA: NEGATIVE
Spec Grav, UA: >=1.03
Urobilinogen, UA: 0.2
pH, UA: 5

## 2014-06-02 MED ORDER — CIPROFLOXACIN HCL 250 MG PO TABS: 250.0000 mg | ORAL_TABLET | Freq: Two times a day (BID) | ORAL | Status: DC

## 2014-06-02 NOTE — Progress Notes (Signed)
Chief Complaint:  Chief Complaint  Patient presents with  . Dysuria    x 1 week    HPI: Krystal Moreno is a 23 y.o. female who is here for has had dysuria for 1 week , she is also having a cold.  No fevers or chills or nausea or vomiting, no blood in urine or stool renal stone She has no hx of kidney stones, has ahd poor po Has had UTI and feels the same She LMP was 1 week ago She had a pap last year and was normal No vaginal discharge, Has had some back pain  No pelivic pain, she had increase frequency and dysuria.   Past Medical History  Diagnosis Date  . Anxiety    History reviewed. No pertinent past surgical history. History   Social History  . Marital Status: Single    Spouse Name: N/A    Number of Children: N/A  . Years of Education: N/A   Social History Main Topics  . Smoking status: Never Smoker   . Smokeless tobacco: None  . Alcohol Use: No  . Drug Use: No  . Sexual Activity: Yes    Birth Control/ Protection: None   Other Topics Concern  . None   Social History Narrative   Family History  Problem Relation Age of Onset  . Cancer Maternal Grandmother    No Known Allergies Prior to Admission medications   Medication Sig Start Date End Date Taking? Authorizing Provider  ibuprofen (ADVIL,MOTRIN) 800 MG tablet Take 1 tablet (800 mg total) by mouth 3 (three) times daily. Patient not taking: Reported on 06/02/2014 04/17/14   Mora BellmanHannah S Merrell, PA-C  nitrofurantoin, macrocrystal-monohydrate, (MACROBID) 100 MG capsule Take 1 capsule (100 mg total) by mouth 2 (two) times daily. Patient not taking: Reported on 06/02/2014 09/04/13   Raymon Muttonyan M Dunn, PA-C     ROS: The patient denies fevers, chills, night sweats, unintentional weight loss, chest pain, palpitations, wheezing, dyspnea on exertion, nausea, vomiting, abdominal pain,  hematuria, melena, numbness, weakness, or tingling.   All other systems have been reviewed and were otherwise negative with the  exception of those mentioned in the HPI and as above.    PHYSICAL EXAM: Filed Vitals:   06/02/14 0834  BP: 116/74  Pulse: 74  Temp: 99.1 F (37.3 C)  Resp: 16   Filed Vitals:   06/02/14 0834  Height: 5\' 2"  (1.575 m)  Weight: 103 lb 9.6 oz (46.993 kg)   Body mass index is 18.94 kg/(m^2).  General: Alert, no acute distress HEENT:  Normocephalic, atraumatic, oropharynx patent. EOMI, PERRLA Cardiovascular:  Regular rate and rhythm, no rubs murmurs or gallops.  No Carotid bruits, radial pulse intact. No pedal edema.  Respiratory: Clear to auscultation bilaterally.  No wheezes, rales, or rhonchi.  No cyanosis, no use of accessory musculature GI: No organomegaly, abdomen is soft and non-tender, positive bowel sounds.  No masses. Skin: No rashes. Neurologic: Facial musculature symmetric. Psychiatric: Patient is appropriate throughout our interaction. Lymphatic: No cervical lymphadenopathy Musculoskeletal: Gait intact. No CVA tenderness   LABS: Results for orders placed or performed in visit on 06/02/14  POCT UA - Microscopic Only  Result Value Ref Range   WBC, Ur, HPF, POC 6-16    RBC, urine, microscopic 3-16    Bacteria, U Microscopic 1+    Mucus, UA positive    Epithelial cells, urine per micros 1-7    Crystals, Ur, HPF, POC neg    Casts, Ur,  LPF, POC neg    Yeast, UA neg   POCT urinalysis dipstick  Result Value Ref Range   Color, UA yellow    Clarity, UA clear    Glucose, UA neg    Bilirubin, UA neg    Ketones, UA trace    Spec Grav, UA >=1.030    Blood, UA small    pH, UA 5.0    Protein, UA trace    Urobilinogen, UA 0.2    Nitrite, UA neg    Leukocytes, UA Negative      EKG/XRAY:   [facility]   ASSESSMENT/PLAN: Encounter Diagnoses  Name Primary?  . Dysuria Yes  . Increased urinary frequency    Early UTI vs possible renal stones Try AZO otc, push fluids If no improvement then try cipro 250 mg BID x 3 days F/u  prn   Gross sideeffects, risk and benefits, and alternatives of medications d/w patient. Patient is aware that all medications have potential sideeffects and we are unable to predict every sideeffect or drug-drug interaction that may occur.  Hamilton CapriLE, Chidubem Chaires PHUONG, DO 06/02/2014 9:31 AM

## 2014-06-02 NOTE — Patient Instructions (Signed)

## 2014-10-29 ENCOUNTER — Ambulatory Visit (INDEPENDENT_AMBULATORY_CARE_PROVIDER_SITE_OTHER): Payer: Self-pay | Admitting: Family Medicine

## 2014-10-29 VITALS — BP 112/68 | HR 98 | Temp 98.0°F | Resp 17 | Ht 61.5 in | Wt 108.0 lb

## 2014-10-29 DIAGNOSIS — R059 Cough, unspecified: Secondary | ICD-10-CM

## 2014-10-29 DIAGNOSIS — J029 Acute pharyngitis, unspecified: Secondary | ICD-10-CM

## 2014-10-29 DIAGNOSIS — R05 Cough: Secondary | ICD-10-CM

## 2014-10-29 DIAGNOSIS — J069 Acute upper respiratory infection, unspecified: Secondary | ICD-10-CM

## 2014-10-29 NOTE — Progress Notes (Signed)
[facility] [address], [place] [address] [phone]  Date:  10/29/2014   Name:  Krystal Moreno   DOB:  08-Oct-1990   MRN:  [id number]  PCP:  No PCP Per Patient    Chief Complaint: Nasal Congestion; Back Pain; Generalized Body Aches; Chest Pain; Sore Throat; and Cough   History of Present Illness:  Krystal Moreno is a 24 y.o. very pleasant female patient who presents with the following:  Today is Wednesday- on Saturday she noted a ST and horase voice. This is now better, but she notes onset of runny nose, headache, chest soreness, cough, body aches, mucus from her nose and mouth.   She has not noted a fever at home No Gi symptoms.  She is generally in good health At home she has tried some OTC cold preps but no anitpyreitcs today so far  There are no active problems to display for this patient.   Past Medical History  Diagnosis Date  . Anxiety     No past surgical history on file.  History  Substance Use Topics  . Smoking status: Never Smoker   . Smokeless tobacco: Not on file  . Alcohol Use: No    Family History  Problem Relation Age of Onset  . Cancer Maternal Grandmother     No Known Allergies  Medication list has been reviewed and updated.  Current Outpatient Prescriptions on File Prior to Visit  Medication Sig Dispense Refill  . ibuprofen (ADVIL,MOTRIN) 800 MG tablet Take 1 tablet (800 mg total) by mouth 3 (three) times daily. (Patient not taking: Reported on 06/02/2014) 21 tablet 0   No current facility-administered medications on file prior to visit.    Review of Systems:  As per HPI- otherwise negative.   Physical Examination: Filed Vitals:   10/29/14 0837  BP: 112/68  Pulse: 98  Temp: 98 F (36.7 C)  Resp: 17   Filed Vitals:   10/29/14 0837  Height: 5' 1.5" (1.562 m)  Weight: 108 lb (48.988 kg)   Body mass index is 20.08 kg/(m^2). Ideal Body Weight: Weight in (lb) to have BMI = 25: 134.2  GEN:  WDWN, NAD, Non-toxic, A & O x 3, looks well except for congestion HEENT: Atraumatic, Normocephalic. Neck supple. No masses, No LAD.  Bilateral TM wnl, oropharynx normal.  PEERL,EOMI.  Clear fluid behind bilateral TM  Ears and Nose: No external deformity.   CV: RRR, No M/G/R. No JVD. No thrill. No extra heart sounds. PULM: CTA B, no wheezes, crackles, rhonchi. No retractions. No resp. distress. No accessory muscle use. ABD: S, NT, ND No rebound. No HSM. EXTR: No c/c/e NEURO Normal gait.  PSYCH: Normally interactive. Conversant. Not depressed or anxious appearing.  Calm demeanor.    Assessment and Plan: Viral URI  Sore throat  Cough  Treat with supportive measures.  Sample of delsym and cepachol drops.  Follow-up if not better soon  Signed Abbe AmsterdamJessica Wilton Thrall, MD

## 2014-10-29 NOTE — Patient Instructions (Signed)
It appears that you have a bad cold. Rest, drink plenty of fluids.  Try to get some extra sleep! Use the delsym and cough drops as needed Tylenol and/ or ibuprofen will help with aches and fatigue Let me know if you do not feel better in the next few days- Sooner if worse.

## 2016-07-04 ENCOUNTER — Ambulatory Visit (INDEPENDENT_AMBULATORY_CARE_PROVIDER_SITE_OTHER): Payer: BLUE CROSS/BLUE SHIELD | Admitting: Urgent Care

## 2016-07-04 VITALS — BP 100/60 | HR 80 | Temp 98.3°F | Resp 16 | Ht 61.5 in | Wt 122.0 lb

## 2016-07-04 DIAGNOSIS — R300 Dysuria: Secondary | ICD-10-CM

## 2016-07-04 DIAGNOSIS — N3090 Cystitis, unspecified without hematuria: Secondary | ICD-10-CM | POA: Diagnosis not present

## 2016-07-04 DIAGNOSIS — H938X2 Other specified disorders of left ear: Secondary | ICD-10-CM

## 2016-07-04 LAB — POCT URINALYSIS DIP (MANUAL ENTRY)
BILIRUBIN UA: NEGATIVE
Bilirubin, UA: NEGATIVE
Blood, UA: NEGATIVE
Glucose, UA: NEGATIVE
Leukocytes, UA: NEGATIVE
Nitrite, UA: NEGATIVE
PH UA: 7
Spec Grav, UA: 1.025
Urobilinogen, UA: 0.2

## 2016-07-04 LAB — POC MICROSCOPIC URINALYSIS (UMFC)

## 2016-07-04 MED ORDER — NITROFURANTOIN MONOHYD MACRO 100 MG PO CAPS: 100.0000 mg | ORAL_CAPSULE | Freq: Two times a day (BID) | ORAL | 0 refills | Status: DC

## 2016-07-04 NOTE — Patient Instructions (Addendum)
Urinary Tract Infection, Adult Introduction A urinary tract infection (UTI) is an infection of any part of the urinary tract. The urinary tract includes the:  Kidneys.  Ureters.  Bladder.  Urethra. These organs make, store, and get rid of pee (urine) in the body. Follow these instructions at home:  Take over-the-counter and prescription medicines only as told by your doctor.  If you were prescribed an antibiotic medicine, take it as told by your doctor. Do not stop taking the antibiotic even if you start to feel better.  Avoid the following drinks:  Alcohol.  Caffeine.  Tea.  Carbonated drinks.  Drink enough fluid to keep your pee clear or pale yellow.  Keep all follow-up visits as told by your doctor. This is important.  Make sure to:  Empty your bladder often and completely. Do not to hold pee for long periods of time.  Empty your bladder before and after sex.  Wipe from front to back after a bowel movement if you are female. Use each tissue one time when you wipe. Contact a doctor if:  You have back pain.  You have a fever.  You feel sick to your stomach (nauseous).  You throw up (vomit).  Your symptoms do not get better after 3 days.  Your symptoms go away and then come back. Get help right away if:  You have very bad back pain.  You have very bad lower belly (abdominal) pain.  You are throwing up and cannot keep down any medicines or water. This information is not intended to replace advice given to you by your health care provider. Make sure you discuss any questions you have with your health care provider. Document Released: 11/16/2007 Document Revised: 11/05/2015 Document Reviewed: 04/20/2015  2017 Elsevier   For ear popping, fullness, nasal congestion and post-nasal drainage you may use a combination of Zyrtec (10mg  daily) and Sudafed (120mg  twice daily).     IF you received an x-ray today, you will receive an invoice from [facility]. Please contact [facility] at [phone] with questions or concerns regarding your invoice.   IF you received labwork today, you will receive an invoice from [facility]. Please contact [facility] at [phone] with questions or concerns regarding your invoice.   Our billing staff will not be able to assist you with questions regarding bills from these companies.  You will be contacted with the lab results as soon as they are available. The fastest way to get your results is to activate your My Chart account. Instructions are located on the last page of this paperwork. If you have not heard from us regarding the results in 2 weeks, please contact this office.

## 2016-07-04 NOTE — Progress Notes (Signed)
    MRN: [id number] DOB: 12/12/1990  Subjective:   Krystal Moreno is a 26 y.o. female presenting for chief complaint of Urinary Tract Infection (painful urination, took azo, urinary ordor started last night )  Reports 3 day history of dysuria, urinary frequency, urinary urgency, pelvic pain and cloudy malordorous urine, nausea. Has tried Azo, ibuprofen relief. Denies fever, hematuria, flank pain, abdominal pain, genital rash and vaginal discharge, vomiting. Patient drinks green tea regularly. Does not hydrate well. Denies smoking cigarettes.  Darien Ramusna is not currently taking any medications. Patient has No Known Allergies.  Darien Ramusna  has a past medical history of Anxiety. Also denies past surgical history.  Objective:   Vitals: BP 100/60 (BP Location: Right Arm, Patient Position: Sitting, Cuff Size: Small)   Pulse 80   Temp 98.3 F (36.8 C) (Oral)   Resp 16   Ht 5' 1.5" (1.562 m)   Wt 122 lb (55.3 kg)   LMP 06/22/2016   SpO2 99%   BMI 22.68 kg/m   Physical Exam  Constitutional: She is oriented to person, place, and time. She appears well-developed and well-nourished.  HENT:  TM's intact bilaterally, no effusions or erythema.  Cardiovascular: Normal rate, regular rhythm and intact distal pulses.  Exam reveals no gallop and no friction rub.   No murmur heard. Pulmonary/Chest: No respiratory distress. She has no wheezes. She has no rales.  Abdominal: Soft. Bowel sounds are normal. She exhibits no distension and no mass. There is no tenderness. There is no guarding.  No CVA tenderness.  Neurological: She is alert and oriented to person, place, and time.  Skin: Skin is warm and dry.   Results for orders placed or performed in visit on 07/04/16 (from the past 24 hour(s))  POCT urinalysis dipstick     Status: Abnormal   Collection Time: 07/04/16  4:28 PM  Result Value Ref Range   Color, UA yellow yellow   Clarity, UA clear clear   Glucose, UA negative negative   Bilirubin, UA negative  negative   Ketones, POC UA negative negative   Spec Grav, UA 1.025    Blood, UA negative negative   pH, UA 7.0    Protein Ur, POC trace (A) negative   Urobilinogen, UA 0.2    Nitrite, UA Negative Negative   Leukocytes, UA Negative Negative  POCT Microscopic Urinalysis ([facility])     Status: Abnormal   Collection Time: 07/04/16  4:36 PM  Result Value Ref Range   WBC,UR,HPF,POC Moderate (A) None WBC/hpf   RBC,UR,HPF,POC None None RBC/hpf   Bacteria Moderate (A) None, Too numerous to count   Mucus Present (A) Absent   Epithelial Cells, UR Per Microscopy Many (A) None, Too numerous to count cells/hpf   Assessment and Plan :   1. Cystitis 2. Dysuria - Will manage with Macrobid. Urine culture is pending.  3. Sensation of fullness in left ear - Recommended conservative management with Zyrtec and/or Sudafed.   Wallis BambergMario Hyatt Capobianco, PA-C [facility] [phone] 07/04/2016 4:16 PM

## 2016-07-06 LAB — URINE CULTURE

## 2019-09-13 ENCOUNTER — Ambulatory Visit: Payer: Self-pay | Attending: Internal Medicine

## 2019-09-13 DIAGNOSIS — Z20822 Contact with and (suspected) exposure to covid-19: Secondary | ICD-10-CM | POA: Insufficient documentation

## 2019-09-14 LAB — SARS-COV-2, NAA 2 DAY TAT

## 2019-09-14 LAB — NOVEL CORONAVIRUS, NAA: SARS-CoV-2, NAA: NOT DETECTED

## 2020-05-13 DIAGNOSIS — J029 Acute pharyngitis, unspecified: Secondary | ICD-10-CM | POA: Diagnosis not present

## 2020-08-18 DIAGNOSIS — N760 Acute vaginitis: Secondary | ICD-10-CM | POA: Diagnosis not present

## 2020-08-18 DIAGNOSIS — L293 Anogenital pruritus, unspecified: Secondary | ICD-10-CM | POA: Diagnosis not present

## 2020-08-18 DIAGNOSIS — R309 Painful micturition, unspecified: Secondary | ICD-10-CM | POA: Diagnosis not present

## 2020-10-19 DIAGNOSIS — L905 Scar conditions and fibrosis of skin: Secondary | ICD-10-CM | POA: Diagnosis not present

## 2020-10-19 DIAGNOSIS — L301 Dyshidrosis [pompholyx]: Secondary | ICD-10-CM | POA: Diagnosis not present

## 2020-12-11 DIAGNOSIS — Z20822 Contact with and (suspected) exposure to covid-19: Secondary | ICD-10-CM | POA: Diagnosis not present

## 2020-12-15 DIAGNOSIS — Z20822 Contact with and (suspected) exposure to covid-19: Secondary | ICD-10-CM | POA: Diagnosis not present

## 2021-01-13 DIAGNOSIS — B372 Candidiasis of skin and nail: Secondary | ICD-10-CM | POA: Diagnosis not present

## 2021-01-13 DIAGNOSIS — L700 Acne vulgaris: Secondary | ICD-10-CM | POA: Diagnosis not present

## 2021-07-09 DIAGNOSIS — Z6824 Body mass index (BMI) 24.0-24.9, adult: Secondary | ICD-10-CM | POA: Diagnosis not present

## 2021-07-09 DIAGNOSIS — Z01419 Encounter for gynecological examination (general) (routine) without abnormal findings: Secondary | ICD-10-CM | POA: Diagnosis not present

## 2021-09-01 ENCOUNTER — Encounter: Payer: Self-pay | Admitting: Family Medicine

## 2021-09-01 ENCOUNTER — Other Ambulatory Visit: Payer: Self-pay

## 2021-09-01 ENCOUNTER — Ambulatory Visit (INDEPENDENT_AMBULATORY_CARE_PROVIDER_SITE_OTHER): Payer: BC Managed Care – PPO | Admitting: Family Medicine

## 2021-09-01 VITALS — BP 110/68 | HR 85 | Temp 98.7°F | Ht 61.0 in | Wt 132.0 lb

## 2021-09-01 DIAGNOSIS — Z0000 Encounter for general adult medical examination without abnormal findings: Secondary | ICD-10-CM

## 2021-09-01 DIAGNOSIS — Z13220 Encounter for screening for lipoid disorders: Secondary | ICD-10-CM

## 2021-09-01 DIAGNOSIS — Z1329 Encounter for screening for other suspected endocrine disorder: Secondary | ICD-10-CM

## 2021-09-01 DIAGNOSIS — Z1159 Encounter for screening for other viral diseases: Secondary | ICD-10-CM

## 2021-09-01 DIAGNOSIS — Z7689 Persons encountering health services in other specified circumstances: Secondary | ICD-10-CM | POA: Diagnosis not present

## 2021-09-01 NOTE — Progress Notes (Signed)
? ?Subjective:  ? ? Patient ID: Krystal Moreno, female    DOB: 1990/12/25, 31 y.o.   MRN: [id number] ? ?HPI ?Chief Complaint  ?Patient presents with  ? Establish Care  ?  No concerns. Has never had a PCP before  ? ? ?She normally sees her OB/GYN.  ?She is new to the practice and here for a complete physical exam. ? ? ?Social history: she has a Location manager, works for an Scientist, forensic, ?Denies smoking or drug use  ?Alcohol is socially  ?Diet: healthy  ?Excerise: regular  ? ?Immunizations: needs Tdap. Declines flu or Covid  ? ?Health maintenance:   ?Last Gynecological Exam: UTD ?Last Dental Exam: UTD  ?Last Eye Exam: years  ? ?Wears seatbelt always, uses sunscreen, smoke detectors in home and functioning, does not text while driving and feels safe in home environment.  ? ?Reviewed allergies, medications, past medical, surgical, family, and social history. ? ? ? ?Review of Systems ?Review of Systems ?Constitutional: -fever, -chills, -sweats, -unexpected weight change,-fatigue ?ENT: -runny nose, -ear pain, -sore throat ?Cardiology:  -chest pain, -palpitations, -edema ?Respiratory: -cough, -shortness of breath, -wheezing ?Gastroenterology: -abdominal pain, -nausea, -vomiting, -diarrhea, -constipation Hematology: -bleeding or bruising problems ?Musculoskeletal: -arthralgias, -myalgias, -joint swelling, -back pain ?Ophthalmology: -vision changes ?Urology: -dysuria, -difficulty urinating, -hematuria, -urinary frequency, -urgency ?Neurology: -headache, -weakness, -tingling, -numbness ?  ? ? ?   ?Objective:  ? Physical Exam ?BP 110/68 (BP Location: Left Arm, Patient Position: Sitting, Cuff Size: Large)   Pulse 85   Temp 98.7 ?F (37.1 ?C) (Oral)   Ht 5\' 1"  (1.549 m)   Wt 132 lb (59.9 kg)   LMP 07/15/2021   SpO2 99%   BMI 24.94 kg/m?  ? ?General Appearance:    Alert, cooperative, no distress, appears stated age  ?Head:    Normocephalic, without obvious abnormality, atraumatic  ?Eyes:    PERRL, conjunctiva/corneas clear, EOM's  intact  ?Ears:    Normal TM's and external ear canals  ?Nose:   Mask on   ?Throat:   Mask on   ?Neck:   Supple, no lymphadenopathy;  thyroid:  no   enlargement/tenderness/nodules  ?Back:    Spine nontender, no curvature, ROM normal, no CVA     tenderness  ?Lungs:     Clear to auscultation bilaterally without wheezes, rales or     ronchi; respirations unlabored  ?Chest Wall:    No tenderness or deformity  ? Heart:    Regular rate and rhythm, S1 and S2 normal, no murmur, rub ?  or gallop  ?Breast Exam:    OB/GYN  ?Abdomen:     Soft, non-tender, nondistended, normoactive bowel sounds,  ?  no masses, no hepatosplenomegaly  ?Genitalia:    Not done. OB/GYN  ?Rectal:    Not performed due to age<40 and no related complaints  ?Extremities:   No clubbing, cyanosis or edema  ?Pulses:   2+ and symmetric all extremities  ?Skin:   Skin color, texture, turgor normal, no rashes or lesions  ?Lymph nodes:   Cervical, supraclavicular, and axillary nodes normal  ?Neurologic:   CNII-XII intact, normal strength, sensation and gait; reflexes 2+ and symmetric throughout  ?Psych:      Normal mood, affect, hygiene and grooming.   ? ? ?   ?Assessment & Plan:  ?Routine general medical examination at a health care facility - Plan: CBC, Comprehensive metabolic panel, TSH, Lipid panel ? ?Encounter to establish care ? ?Screening for thyroid disorder - Plan: TSH ? ?Screening for lipid  disorders - Plan: Lipid panel ? ?Need for hepatitis C screening test - Plan: Hepatitis C antibody ? ?She is a pleasant 31 year old female who is new to the practice and here today for a CPE.  She is not fasting and would like to return for labs another day.  Orders are in the computer. ?She sees her OB/GYN regularly.  She is also up-to-date on eye exam. ?Reviewed preventive health care.  Counseling on healthy lifestyle including diet and exercise.  Discussed safety and health promotion.  Follow-up pending lab results. ?

## 2021-09-01 NOTE — Patient Instructions (Addendum)
Return to the lab on the first floor Friday or Monday morning (fasting) ? ?Preventive Care 12-32 Years Old, Female ?Preventive care refers to lifestyle choices and visits with your health care provider that can promote health and wellness. Preventive care visits are also called wellness exams. ?What can I expect for my preventive care visit? ?Counseling ?During your preventive care visit, your health care provider may ask about your: ?Medical history, including: ?Past medical problems. ?Family medical history. ?Pregnancy history. ?Current health, including: ?Menstrual cycle. ?Method of birth control. ?Emotional well-being. ?Home life and relationship well-being. ?Sexual activity and sexual health. ?Lifestyle, including: ?Alcohol, nicotine or tobacco, and drug use. ?Access to firearms. ?Diet, exercise, and sleep habits. ?Work and work Statistician. ?Sunscreen use. ?Safety issues such as seatbelt and bike helmet use. ?Physical exam ?Your health care provider may check your: ?Height and weight. These may be used to calculate your BMI (body mass index). BMI is a measurement that tells if you are at a healthy weight. ?Waist circumference. This measures the distance around your waistline. This measurement also tells if you are at a healthy weight and may help predict your risk of certain diseases, such as type 2 diabetes and high blood pressure. ?Heart rate and blood pressure. ?Body temperature. ?Skin for abnormal spots. ?What immunizations do I need? ?Vaccines are usually given at various ages, according to a schedule. Your health care provider will recommend vaccines for you based on your age, medical history, and lifestyle or other factors, such as travel or where you work. ?What tests do I need? ?Screening ?Your health care provider may recommend screening tests for certain conditions. This may include: ?Pelvic exam and Pap test. ?Lipid and cholesterol levels. ?Diabetes screening. This is done by checking your blood  sugar (glucose) after you have not eaten for a while (fasting). ?Hepatitis B test. ?Hepatitis C test. ?HIV (human immunodeficiency virus) test. ?STI (sexually transmitted infection) testing, if you are at risk. ?BRCA-related cancer screening. This may be done if you have a family history of breast, ovarian, tubal, or peritoneal cancers. ?Talk with your health care provider about your test results, treatment options, and if necessary, the need for more tests. ?Follow these instructions at home: ?Eating and drinking ? ?Eat a healthy diet that includes fresh fruits and vegetables, whole grains, lean protein, and low-fat dairy products. ?Take vitamin and mineral supplements as recommended by your health care provider. ?Do not drink alcohol if: ?Your health care provider tells you not to drink. ?You are pregnant, may be pregnant, or are planning to become pregnant. ?If you drink alcohol: ?Limit how much you have to 0-1 drink a day. ?Know how much alcohol is in your drink. In the [place] one drink equals one 12 oz bottle of beer (355 mL), one 5 oz glass of wine (148 mL), or one 1? oz glass of hard liquor (44 mL). ?Lifestyle ?Brush your teeth every morning and night with fluoride toothpaste. Floss one time each day. ?Exercise for at least 30 minutes 5 or more days each week. ?Do not use any products that contain nicotine or tobacco. These products include cigarettes, chewing tobacco, and vaping devices, such as e-cigarettes. If you need help quitting, ask your health care provider. ?Do not use drugs. ?If you are sexually active, practice safe sex. Use a condom or other form of protection to prevent STIs. ?If you do not wish to become pregnant, use a form of birth control. If you plan to become pregnant, see your health care provider  for a prepregnancy visit. ?Find healthy ways to manage stress, such as: ?Meditation, yoga, or listening to music. ?Journaling. ?Talking to a trusted person. ?Spending time with friends and  family. ?Minimize exposure to UV radiation to reduce your risk of skin cancer. ?Safety ?Always wear your seat belt while driving or riding in a vehicle. ?Do not drive: ?If you have been drinking alcohol. Do not ride with someone who has been drinking. ?If you have been using any mind-altering substances or drugs. ?While texting. ?When you are tired or distracted. ?Wear a helmet and other protective equipment during sports activities. ?If you have firearms in your house, make sure you follow all gun safety procedures. ?Seek help if you have been physically or sexually abused. ?What's next? ?Go to your health care provider once a year for an annual wellness visit. ?Ask your health care provider how often you should have your eyes and teeth checked. ?Stay up to date on all vaccines. ?This information is not intended to replace advice given to you by your health care provider. Make sure you discuss any questions you have with your health care provider. ?Document Revised: 11/25/2020 Document Reviewed: 11/25/2020 ?Elsevier Patient Education ? [place]. ? ?

## 2021-09-06 ENCOUNTER — Other Ambulatory Visit (INDEPENDENT_AMBULATORY_CARE_PROVIDER_SITE_OTHER): Payer: BC Managed Care – PPO

## 2021-09-06 DIAGNOSIS — Z1329 Encounter for screening for other suspected endocrine disorder: Secondary | ICD-10-CM

## 2021-09-06 DIAGNOSIS — Z13220 Encounter for screening for lipoid disorders: Secondary | ICD-10-CM

## 2021-09-06 DIAGNOSIS — Z0000 Encounter for general adult medical examination without abnormal findings: Secondary | ICD-10-CM | POA: Diagnosis not present

## 2021-09-06 DIAGNOSIS — Z1159 Encounter for screening for other viral diseases: Secondary | ICD-10-CM

## 2021-09-06 LAB — LIPID PANEL
Cholesterol: 176 mg/dL (ref 0–200)
HDL: 50.5 mg/dL (ref >39.00)
LDL Cholesterol: 99 mg/dL (ref 0–99)
NonHDL: 125.87
Total CHOL/HDL Ratio: 3
Triglycerides: 134 mg/dL (ref 0.0–149.0)
VLDL: 26.8 mg/dL (ref 0.0–40.0)

## 2021-09-06 LAB — COMPREHENSIVE METABOLIC PANEL WITH GFR
ALT: 12 U/L (ref 0–35)
AST: 14 U/L (ref 0–37)
Albumin: 4.7 g/dL (ref 3.5–5.2)
Alkaline Phosphatase: 46 U/L (ref 39–117)
BUN: 11 mg/dL (ref 6–23)
CO2: 28 meq/L (ref 19–32)
Calcium: 9.1 mg/dL (ref 8.4–10.5)
Chloride: 103 meq/L (ref 96–112)
Creatinine, Ser: 0.53 mg/dL (ref 0.40–1.20)
GFR: 122.71 mL/min
Glucose, Bld: 86 mg/dL (ref 70–99)
Potassium: 4.2 meq/L (ref 3.5–5.1)
Sodium: 135 meq/L (ref 135–145)
Total Bilirubin: 0.5 mg/dL (ref 0.2–1.2)
Total Protein: 7.4 g/dL (ref 6.0–8.3)

## 2021-09-06 LAB — CBC
HCT: 38.8 % (ref 36.0–46.0)
Hemoglobin: 13.2 g/dL (ref 12.0–15.0)
MCHC: 34 g/dL (ref 30.0–36.0)
MCV: 90.5 fl (ref 78.0–100.0)
Platelets: 265 10*3/uL (ref 150.0–400.0)
RBC: 4.28 Mil/uL (ref 3.87–5.11)
RDW: 13 % (ref 11.5–15.5)
WBC: 7.9 10*3/uL (ref 4.0–10.5)

## 2021-09-07 LAB — HEPATITIS C ANTIBODY
Hepatitis C Ab: NONREACTIVE
SIGNAL TO CUT-OFF: 0.04 (ref <1.00)

## 2021-09-07 LAB — TSH: TSH: 2.29 u[IU]/mL (ref 0.35–5.50)

## 2021-09-29 ENCOUNTER — Encounter: Payer: Self-pay | Admitting: Family Medicine

## 2021-09-29 ENCOUNTER — Ambulatory Visit (INDEPENDENT_AMBULATORY_CARE_PROVIDER_SITE_OTHER): Payer: BC Managed Care – PPO | Admitting: Family Medicine

## 2021-09-29 VITALS — BP 108/72 | HR 90 | Temp 98.6°F | Ht 61.0 in | Wt 124.0 lb

## 2021-09-29 DIAGNOSIS — R0789 Other chest pain: Secondary | ICD-10-CM

## 2021-09-29 DIAGNOSIS — R051 Acute cough: Secondary | ICD-10-CM | POA: Diagnosis not present

## 2021-09-29 DIAGNOSIS — J029 Acute pharyngitis, unspecified: Secondary | ICD-10-CM | POA: Diagnosis not present

## 2021-09-29 NOTE — Patient Instructions (Signed)
?  I recommend taking over-the-counter Mucinex DM and Xyzal.   ?You can take Delsym at bedtime.  ? ?Increase your water intake.  You can also take Tylenol or ibuprofen for the chest wall pain you have with cough. ? ?Let me know if you have any new or worsening symptoms ?

## 2021-09-29 NOTE — Progress Notes (Signed)
? ?  Acute Office Visit ? ?Subjective:  ? ?  ?Patient ID: Krystal Moreno, female    DOB: 01-18-1991, 31 y.o.   MRN: [id number] ? ?Chief Complaint  ?Patient presents with  ? Allergies  ?  x1 week accompanied by sore throat, dry cough, chest pain and congestion  ? ? ?HPI ?Patient is in today for complaints of a 1 week history of sore throat, post nasal drainage, dry cough and chest congestion. Cough is worse in the morning. States her throat feels itchy.  ?Reports chest wall pain with cough only.  ? ?Denies fever, chills, body aches, ear pain, palpitations, shortness of breath, abdominal pain, N/V/D ? ?She took Allegra but only 1 dose.  ?Never had a problem with allergies in the past.  ? ? ? ?ROS ?Pertinent positives and negatives in the history of present illness. ? ? ?   ?Objective:  ?  ?BP 108/72 (BP Location: Left Arm, Patient Position: Sitting, Cuff Size: Large)   Pulse 90   Temp 98.6 ?F (37 ?C) (Temporal)   Ht 5\' 1"  (1.549 m)   Wt 124 lb (56.2 kg)   SpO2 97%   BMI 23.43 kg/m?  ?BP Readings from Last 3 Encounters:  ?09/29/21 108/72  ?09/01/21 110/68  ?07/04/16 100/60  ? ?Wt Readings from Last 3 Encounters:  ?09/29/21 124 lb (56.2 kg)  ?09/01/21 132 lb (59.9 kg)  ?07/04/16 122 lb (55.3 kg)  ? ?  ? ?Physical Exam ?Constitutional:   ?   General: She is not in acute distress. ?   Appearance: Normal appearance. She is not ill-appearing.  ?HENT:  ?   Mouth/Throat:  ?   Mouth: Mucous membranes are moist.  ?   Pharynx: No oropharyngeal exudate or posterior oropharyngeal erythema.  ?Eyes:  ?   Conjunctiva/sclera: Conjunctivae normal.  ?   Pupils: Pupils are equal, round, and reactive to light.  ?Cardiovascular:  ?   Rate and Rhythm: Normal rate and regular rhythm.  ?   Pulses: Normal pulses.  ?Pulmonary:  ?   Effort: Pulmonary effort is normal.  ?   Breath sounds: Normal breath sounds.  ?Musculoskeletal:  ?   Cervical back: Normal range of motion and neck supple.  ?Lymphadenopathy:  ?   Cervical: No cervical adenopathy.   ?Skin: ?   General: Skin is warm and dry.  ?Neurological:  ?   General: No focal deficit present.  ?   Mental Status: She is alert.  ?Psychiatric:     ?   Mood and Affect: Mood normal.  ? ? ?No results found for any visits on 09/29/21. ? ? ?   ?Assessment & Plan:  ? ?Problem List Items Addressed This Visit   ?None ?Visit Diagnoses   ? ? Acute cough    -  Primary  ? Acute pharyngitis, unspecified etiology      ? Chest wall tenderness      ? ?  ? ?Discussed that she appears to have a viral illness vs allergies.  Declines strep swab.  Chest wall pain with coughing only.  She may take Tylenol or ibuprofen for this.  We discussed symptomatic treatment in depth.  May continue on Xyzal for allergies. She will follow-up if worsening or not improving over the next week. ? ?No orders of the defined types were placed in this encounter. ? ? ?Return if symptoms worsen or fail to improve. ? ?Harland Dingwall, NP-C ? ? ?

## 2021-11-04 ENCOUNTER — Ambulatory Visit (INDEPENDENT_AMBULATORY_CARE_PROVIDER_SITE_OTHER): Payer: BC Managed Care – PPO | Admitting: Family Medicine

## 2021-11-04 ENCOUNTER — Ambulatory Visit: Payer: BC Managed Care – PPO | Admitting: Family Medicine

## 2021-11-04 ENCOUNTER — Encounter: Payer: Self-pay | Admitting: Family Medicine

## 2021-11-04 ENCOUNTER — Ambulatory Visit (INDEPENDENT_AMBULATORY_CARE_PROVIDER_SITE_OTHER): Payer: BC Managed Care – PPO

## 2021-11-04 VITALS — BP 104/66 | HR 91 | Temp 97.7°F | Ht 61.0 in | Wt 123.0 lb

## 2021-11-04 DIAGNOSIS — R058 Other specified cough: Secondary | ICD-10-CM | POA: Diagnosis not present

## 2021-11-04 DIAGNOSIS — R6883 Chills (without fever): Secondary | ICD-10-CM

## 2021-11-04 DIAGNOSIS — R52 Pain, unspecified: Secondary | ICD-10-CM | POA: Diagnosis not present

## 2021-11-04 DIAGNOSIS — R059 Cough, unspecified: Secondary | ICD-10-CM | POA: Diagnosis not present

## 2021-11-04 LAB — POC COVID19 BINAXNOW: SARS Coronavirus 2 Ag: NEGATIVE

## 2021-11-04 MED ORDER — AZITHROMYCIN 250 MG PO TABS: ORAL_TABLET | ORAL | 0 refills | Status: AC

## 2021-11-04 NOTE — Progress Notes (Signed)
Subjective:     Patient ID: Krystal Moreno, female    DOB: Jan 20, 1991, 31 y.o.   MRN: [id number]  Chief Complaint  Patient presents with   Cough    Productive cough with green sputum    Generalized Body Aches    Started 5/23   Nasal Congestion    5/23   Chills    Chills and feeling cold since yesterday    HPI Patient is in today for a productive cough, chills, body aches, headache, rhinorrhea, nasal congestion.   No fever, dizziness, chest pain, palpitations, shortness of breath, abdominal pain, N/V/D.   I saw her for cough on 09/29/2021 and treated her for a viral illness and possible allergies. Cough never resolved.   Denies any chance of pregnancy, abstinence.   Health Maintenance Due  Topic Date Due   HIV Screening  Never done   TETANUS/TDAP  Never done    Past Medical History:  Diagnosis Date   Anxiety     History reviewed. No pertinent surgical history.  Family History  Problem Relation Age of Onset   Cancer Maternal Grandmother     Social History   Socioeconomic History   Marital status: Single    Spouse name: Not on file   Number of children: Not on file   Years of education: Not on file   Highest education level: Not on file  Occupational History   Not on file  Tobacco Use   Smoking status: Never   Smokeless tobacco: Never  Substance and Sexual Activity   Alcohol use: No   Drug use: No   Sexual activity: Yes    Birth control/protection: None  Other Topics Concern   Not on file  Social History Narrative   Not on file   Social Determinants of Health   Financial Resource Strain: Not on file  Food Insecurity: Not on file  Transportation Needs: Not on file  Physical Activity: Not on file  Stress: Not on file  Social Connections: Not on file  Intimate Partner Violence: Not on file    No outpatient medications prior to visit.   No facility-administered medications prior to visit.    No Known Allergies  ROS Pertinent positives and  negatives in the history of present illness.     Objective:    Physical Exam  BP 104/66 (BP Location: Left Arm, Patient Position: Sitting, Cuff Size: Large)   Pulse 91   Temp 97.7 F (36.5 C) (Temporal)   Ht 5\' 1"  (1.549 m)   Wt 123 lb (55.8 kg)   LMP 10/09/2021   SpO2 98%   BMI 23.24 kg/m  Wt Readings from Last 3 Encounters:  11/04/21 123 lb (55.8 kg)  09/29/21 124 lb (56.2 kg)  09/01/21 132 lb (59.9 kg)   Alert and in no distress. Ill appearing.  Cardiac exam shows a regular rate and rhythm without murmurs or gallops. Lungs are clear to auscultation.     Assessment & Plan:   Problem List Items Addressed This Visit       Other   Cough present for greater than 3 weeks - Primary   Relevant Medications   azithromycin (ZITHROMAX) 250 MG tablet   Other Relevant Orders   DG Chest 2 View   Other Visit Diagnoses     Body aches       Relevant Orders   POC COVID-19 (Completed)   Chills       Relevant Orders   POC COVID-19 (Completed)  I am having Euretha Doughman start on azithromycin.  Meds ordered this encounter  Medications   azithromycin (ZITHROMAX) 250 MG tablet    Sig: Take 2 tablets on day 1, then 1 tablet daily on days 2 through 5    Dispense:  6 tablet    Refill:  0    Order Specific Question:   Supervising Provider    Answer:   Hillard Danker A [[id number]]   COVID test negative in office. Z-Pak prescribed.  Chest x-ray ordered.  Discussed symptomatic treatment.  I recommend she stay out of work today and may go back tomorrow if improving.

## 2022-07-15 LAB — HM PAP SMEAR

## 2022-08-15 ENCOUNTER — Telehealth: Payer: Self-pay | Admitting: Family Medicine

## 2022-08-15 NOTE — Telephone Encounter (Signed)
Called and spoke with Pam, verbal orders were placed in wrong patient chart. Documented under appropriate patient chart and routed to PCP. Please disregard encounter.

## 2022-08-15 NOTE — Telephone Encounter (Signed)
Pam from [facility] called for verbal orders for skilled nursing with a frequency of: 2X for 1 week 1X for 3 weeks 2X a month for 1 month & 2PRN    Pam reports the pt had acute sinusitis on 2.27.24  Please call Pam to confirm:  [phone]

## 2022-08-15 NOTE — Telephone Encounter (Signed)
Ok for verbal orders ?

## 2022-09-07 ENCOUNTER — Encounter: Payer: BC Managed Care – PPO | Admitting: Family Medicine

## 2023-03-09 ENCOUNTER — Ambulatory Visit (INDEPENDENT_AMBULATORY_CARE_PROVIDER_SITE_OTHER): Payer: BC Managed Care – PPO | Admitting: Family Medicine

## 2023-03-09 ENCOUNTER — Encounter: Payer: Self-pay | Admitting: Family Medicine

## 2023-03-09 VITALS — BP 116/72 | HR 74 | Temp 97.6°F | Ht 61.0 in | Wt 127.0 lb

## 2023-03-09 DIAGNOSIS — F419 Anxiety disorder, unspecified: Secondary | ICD-10-CM | POA: Diagnosis not present

## 2023-03-09 DIAGNOSIS — Z0000 Encounter for general adult medical examination without abnormal findings: Secondary | ICD-10-CM | POA: Diagnosis not present

## 2023-03-09 DIAGNOSIS — Z23 Encounter for immunization: Secondary | ICD-10-CM | POA: Diagnosis not present

## 2023-03-09 DIAGNOSIS — N939 Abnormal uterine and vaginal bleeding, unspecified: Secondary | ICD-10-CM

## 2023-03-09 DIAGNOSIS — R5383 Other fatigue: Secondary | ICD-10-CM | POA: Diagnosis not present

## 2023-03-09 DIAGNOSIS — Z0001 Encounter for general adult medical examination with abnormal findings: Secondary | ICD-10-CM | POA: Insufficient documentation

## 2023-03-09 LAB — CBC WITH DIFFERENTIAL/PLATELET
Basophils Absolute: 0 10*3/uL (ref 0.0–0.1)
Basophils Relative: 0.6 % (ref 0.0–3.0)
Eosinophils Absolute: 0.2 10*3/uL (ref 0.0–0.7)
Eosinophils Relative: 2.1 % (ref 0.0–5.0)
HCT: 41.1 % (ref 36.0–46.0)
Hemoglobin: 13.7 g/dL (ref 12.0–15.0)
Lymphocytes Relative: 33.2 % (ref 12.0–46.0)
Lymphs Abs: 2.5 10*3/uL (ref 0.7–4.0)
MCHC: 33.4 g/dL (ref 30.0–36.0)
MCV: 91.7 fl (ref 78.0–100.0)
Monocytes Absolute: 0.6 10*3/uL (ref 0.1–1.0)
Monocytes Relative: 8.5 % (ref 3.0–12.0)
Neutro Abs: 4.2 10*3/uL (ref 1.4–7.7)
Neutrophils Relative %: 55.6 % (ref 43.0–77.0)
Platelets: 314 10*3/uL (ref 150.0–400.0)
RBC: 4.48 Mil/uL (ref 3.87–5.11)
RDW: 13 % (ref 11.5–15.5)
WBC: 7.6 10*3/uL (ref 4.0–10.5)

## 2023-03-09 LAB — COMPREHENSIVE METABOLIC PANEL
ALT: 10 U/L (ref 0–35)
AST: 13 U/L (ref 0–37)
Albumin: 4.7 g/dL (ref 3.5–5.2)
Alkaline Phosphatase: 63 U/L (ref 39–117)
BUN: 18 mg/dL (ref 6–23)
CO2: 26 mEq/L (ref 19–32)
Calcium: 9.4 mg/dL (ref 8.4–10.5)
Chloride: 103 mEq/L (ref 96–112)
Creatinine, Ser: 0.71 mg/dL (ref 0.40–1.20)
GFR: 111.64 mL/min (ref >60.00)
Glucose, Bld: 103 mg/dL — ABNORMAL HIGH (ref 70–99)
Potassium: 3.8 mEq/L (ref 3.5–5.1)
Sodium: 137 mEq/L (ref 135–145)
Total Bilirubin: 0.3 mg/dL (ref 0.2–1.2)
Total Protein: 7.7 g/dL (ref 6.0–8.3)

## 2023-03-09 LAB — TSH: TSH: 1.53 u[IU]/mL (ref 0.35–5.50)

## 2023-03-09 LAB — FOLATE: Folate: 14.1 ng/mL (ref >5.9)

## 2023-03-09 LAB — POCT URINE PREGNANCY: Preg Test, Ur: NEGATIVE

## 2023-03-09 LAB — T4, FREE: Free T4: 0.82 ng/dL (ref 0.60–1.60)

## 2023-03-09 LAB — VITAMIN B12: Vitamin B-12: 263 pg/mL (ref 211–911)

## 2023-03-09 MED ORDER — ESCITALOPRAM OXALATE 5 MG PO TABS: 5.0000 mg | ORAL_TABLET | Freq: Every day | ORAL | 1 refills | Status: DC

## 2023-03-09 NOTE — Assessment & Plan Note (Signed)
Check labs and look for underlying etiology.

## 2023-03-09 NOTE — Assessment & Plan Note (Signed)
Preventive health care reviewed. UTD with OB/GYN visit.  Counseling on healthy lifestyle including diet and exercise.  Recommend regular dental and eye exams.  Immunizations reviewed.  Discussed safety.

## 2023-03-09 NOTE — Assessment & Plan Note (Signed)
Check thyroid function. Follow up with OB/GYN

## 2023-03-09 NOTE — Progress Notes (Signed)
Her labs are fine. Vitamin B12 is low normal so continue taking over the counter vitamin B12.

## 2023-03-09 NOTE — Assessment & Plan Note (Signed)
History of same. No acute distress. Start Lexapro. Recommend counseling. Follow up in 4 wks.

## 2023-03-09 NOTE — Patient Instructions (Addendum)
    Call and schedule with a counselor. A few options are below but you can schedule anywhere.   [email or web address]   [email or web address]   [facility] Multiple locations [phone]     [facility] [address] [place], [place] [address]  Phone: [phone]   [facility]  [address], [place], [place] [address]  Phone: [phone]

## 2023-03-09 NOTE — Progress Notes (Signed)
Subjective:     Patient ID: Krystal Moreno, female    DOB: 28-May-1991, 32 y.o.   MRN: [id number]  Chief Complaint  Patient presents with   Annual Exam    HPI  Discussed the use of AI scribe software for clinical note transcription with the patient, who gave verbal consent to proceed.  History of Present Illness          Here for CPE and with a new complaint.   C/o worsening anxiety for 1 year. Panic attacks.  She stopped caffeine  No counseling since early 25s.   Relationship ended.  Alcohol only in social situations   No hx of addiction   Periods are short typically but it was even shorter this month.  Having sex.   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 No pertinent surgical history.  Family History  Problem Relation Age of Onset   Cancer Maternal Grandmother     Social History   Socioeconomic History   Marital status: Single    Spouse name: Not on file   Number of children: Not on file   Years of education: Not on file   Highest education level: Not on file  Occupational History   Not on file  Tobacco Use   Smoking status: Never   Smokeless tobacco: Never  Substance and Sexual Activity   Alcohol use: No   Drug use: No   Sexual activity: Yes    Birth control/protection: None  Other Topics Concern   Not on file  Social History Narrative   Not on file   Social Determinants of Health   Financial Resource Strain: Not on file  Food Insecurity: Not on file  Transportation  Needs: Not on file  Physical Activity: Not on file  Stress: Not on file  Social Connections: Not on file  Intimate Partner Violence: Not on file    No outpatient medications prior to visit.   No facility-administered medications prior to visit.    No Known Allergies  Review of Systems  Constitutional:  Positive for malaise/fatigue. Negative for chills, fever and weight loss.  HENT:  Negative for congestion, ear pain, sinus pain and sore throat.   Eyes:  Negative for blurred vision, double vision and pain.  Respiratory:  Negative for cough, shortness of breath and wheezing.   Cardiovascular:  Negative for chest pain, palpitations and leg swelling.  Gastrointestinal:  Negative for abdominal pain, constipation, diarrhea, nausea and vomiting.  Genitourinary:  Negative for dysuria, frequency and urgency.  Musculoskeletal:  Negative for back pain, joint pain and myalgias.  Skin:  Negative for rash.  Neurological:  Negative for dizziness, tingling, focal weakness and headaches.  Psychiatric/Behavioral:  Negative for depression and substance abuse. The patient is nervous/anxious.  Objective:    Physical Exam Constitutional:      General: She is not in acute distress.    Appearance: She is not ill-appearing.  HENT:     Right Ear: Tympanic membrane, ear canal and external ear normal.     Left Ear: Tympanic membrane, ear canal and external ear normal.     Nose: Nose normal.     Mouth/Throat:     Mouth: Mucous membranes are moist.     Pharynx: Oropharynx is clear.  Eyes:     Extraocular Movements: Extraocular movements intact.     Conjunctiva/sclera: Conjunctivae normal.     Pupils: Pupils are equal, round, and reactive to light.  Neck:     Thyroid: No thyroid mass, thyromegaly or thyroid tenderness.  Cardiovascular:     Rate and Rhythm: Normal rate and regular rhythm.     Pulses: Normal pulses.     Heart sounds: Normal heart sounds.  Pulmonary:     Effort: Pulmonary  effort is normal.     Breath sounds: Normal breath sounds.  Abdominal:     General: Bowel sounds are normal.     Palpations: Abdomen is soft.     Tenderness: There is no abdominal tenderness. There is no right CVA tenderness, left CVA tenderness, guarding or rebound.  Musculoskeletal:        General: Normal range of motion.     Cervical back: Normal range of motion and neck supple. No tenderness.     Right lower leg: No edema.     Left lower leg: No edema.  Lymphadenopathy:     Cervical: No cervical adenopathy.  Skin:    General: Skin is warm and dry.     Findings: No lesion or rash.  Neurological:     General: No focal deficit present.     Mental Status: She is alert and oriented to person, place, and time.     Cranial Nerves: No cranial nerve deficit.     Sensory: No sensory deficit.     Motor: No weakness.     Gait: Gait normal.  Psychiatric:        Mood and Affect: Mood normal.        Behavior: Behavior normal.        Thought Content: Thought content normal.      BP 116/72 (BP Location: Left Arm, Patient Position: Sitting, Cuff Size: Large)   Pulse 74   Temp 97.6 F (36.4 C) (Temporal)   Ht 5\' 1"  (1.549 m)   Wt 127 lb (57.6 kg)   LMP 03/05/2023 (Approximate)   SpO2 98%   BMI 24.00 kg/m  Wt Readings from Last 3 Encounters:  03/09/23 127 lb (57.6 kg)  11/04/21 123 lb (55.8 kg)  09/29/21 124 lb (56.2 kg)       Assessment & Plan:   Problem List Items Addressed This Visit       Genitourinary   Abnormal uterine bleeding (AUB)    Check thyroid function. Follow up with OB/GYN       Relevant Orders   CBC with Differential/Platelet (Completed)   Comprehensive metabolic panel (Completed)   Iron, TIBC and Ferritin Panel   TSH (Completed)   T4, free (Completed)   POCT urine pregnancy (Completed)     Other   Anxiety    History of same. No acute distress. Start Lexapro. Recommend counseling. Follow up in 4 wks.       Relevant Medications   escitalopram  (LEXAPRO) 5 MG  tablet   Other Relevant Orders   TSH (Completed)   T4, free (Completed)   Encounter for general adult medical examination with abnormal findings - Primary    Preventive health care reviewed. UTD with OB/GYN visit.  Counseling on healthy lifestyle including diet and exercise.  Recommend regular dental and eye exams.  Immunizations reviewed.  Discussed safety.       Fatigue    Check labs and look for underlying etiology.       Relevant Orders   CBC with Differential/Platelet (Completed)   Comprehensive metabolic panel (Completed)   Iron, TIBC and Ferritin Panel   Vitamin B12 (Completed)   TSH (Completed)   T4, free (Completed)   Folate (Completed)   Other Visit Diagnoses     Need for diphtheria-tetanus-pertussis (Tdap) vaccine       Relevant Orders   Tdap vaccine greater than or equal to 7yo IM (Completed)       I am having Abrea Harten start on escitalopram.  Meds ordered this encounter  Medications   escitalopram (LEXAPRO) 5 MG tablet    Sig: Take 1 tablet (5 mg total) by mouth at bedtime.    Dispense:  30 tablet    Refill:  1    Order Specific Question:   Supervising Provider    Answer:   Hillard Danker A [[id number]]

## 2023-03-10 LAB — IRON,TIBC AND FERRITIN PANEL
%SAT: 11 % — ABNORMAL LOW (ref 16–45)
Ferritin: 16 ng/mL (ref 16–154)
Iron: 41 ug/dL (ref 40–190)
TIBC: 387 ug/dL (ref 250–450)

## 2023-03-10 NOTE — Progress Notes (Signed)
Her iron is low normal.

## 2023-03-13 DIAGNOSIS — R350 Frequency of micturition: Secondary | ICD-10-CM | POA: Diagnosis not present

## 2023-03-13 DIAGNOSIS — N3001 Acute cystitis with hematuria: Secondary | ICD-10-CM | POA: Diagnosis not present

## 2023-03-13 DIAGNOSIS — R300 Dysuria: Secondary | ICD-10-CM | POA: Diagnosis not present

## 2023-04-05 ENCOUNTER — Encounter: Payer: Self-pay | Admitting: Family Medicine

## 2023-04-05 ENCOUNTER — Telehealth: Payer: BC Managed Care – PPO | Admitting: Family Medicine

## 2023-04-05 DIAGNOSIS — F419 Anxiety disorder, unspecified: Secondary | ICD-10-CM | POA: Diagnosis not present

## 2023-04-05 DIAGNOSIS — L659 Nonscarring hair loss, unspecified: Secondary | ICD-10-CM | POA: Diagnosis not present

## 2023-04-05 MED ORDER — ESCITALOPRAM OXALATE 10 MG PO TABS
10.0000 mg | ORAL_TABLET | Freq: Every day | ORAL | 2 refills | Status: AC
Start: 2023-04-05 — End: ?

## 2023-04-05 NOTE — Progress Notes (Signed)
MyChart Video Visit    Virtual Visit via Video Note    Patient location: Home. Patient and provider in visit Provider location: Office  I discussed the limitations of evaluation and management by telemedicine and the availability of in person appointments. The patient expressed understanding and agreed to proceed.  Visit Date: 04/05/2023  Today's healthcare provider: Hetty Blend, NP-C     Subjective:    Patient ID: Krystal Moreno, female    DOB: Oct 29, 1990, 32 y.o.   MRN: [id number]  Chief Complaint  Patient presents with   Anxiety    Anxiety Symptoms include nervous/anxious behavior. Patient reports no chest pain, dizziness, nausea, palpitations, shortness of breath or suicidal ideas.      This is a 4 wk follow up on anxiety and starting lexapro 5 mg daily. Improving. No side effects. Has not yet scheduled with a therapist.   C/o loss of hair on scalp. Patch of hair loss. Denies trauma. Hx of same and had injections with dermatologist.    Past Medical History:  Diagnosis Date   Anxiety     History reviewed. No pertinent surgical history.  Family History  Problem Relation Age of Onset   Cancer Maternal Grandmother     Social History   Socioeconomic History   Marital status: Single    Spouse name: Not on file   Number of children: Not on file   Years of education: Not on file   Highest education level: Not on file  Occupational History   Not on file  Tobacco Use   Smoking status: Never   Smokeless tobacco: Never  Substance and Sexual Activity   Alcohol use: No   Drug use: No   Sexual activity: Yes    Birth control/protection: None  Other Topics Concern   Not on file  Social History Narrative   Not on file   Social Determinants of Health   Financial Resource Strain: Not on file  Food Insecurity: Not on file  Transportation Needs: Not on file  Physical Activity: Not on file  Stress: Not on file  Social Connections: Not on file  Intimate  Partner Violence: Not on file    Outpatient Medications Prior to Visit  Medication Sig Dispense Refill   escitalopram (LEXAPRO) 5 MG tablet Take 1 tablet (5 mg total) by mouth at bedtime. 30 tablet 1   No facility-administered medications prior to visit.    No Known Allergies  Review of Systems  Constitutional:  Negative for chills and fever.  Respiratory:  Negative for shortness of breath.   Cardiovascular:  Negative for chest pain, palpitations and leg swelling.  Gastrointestinal:  Negative for abdominal pain, constipation, diarrhea, nausea and vomiting.  Skin:        Patch of hair loss on right posterior scalp   Neurological:  Negative for dizziness and focal weakness.  Psychiatric/Behavioral:  Negative for depression and suicidal ideas. The patient is nervous/anxious.        Objective:    Physical Exam Constitutional:      General: She is not in acute distress.    Appearance: She is not ill-appearing.  HENT:     Head:     Comments: Patch of hair loss on right posterior scalp (see photo) Eyes:     Extraocular Movements: Extraocular movements intact.  Pulmonary:     Effort: Pulmonary effort is normal.  Musculoskeletal:     Cervical back: Normal range of motion.  Skin:    General: Skin is  dry.  Neurological:     General: No focal deficit present.     Mental Status: She is alert and oriented to person, place, and time.  Psychiatric:        Mood and Affect: Mood normal.        Behavior: Behavior normal.        Thought Content: Thought content normal.     LMP 03/05/2023 (Approximate)  Wt Readings from Last 3 Encounters:  03/09/23 127 lb (57.6 kg)  11/04/21 123 lb (55.8 kg)  09/29/21 124 lb (56.2 kg)       Assessment & Plan:   Problem List Items Addressed This Visit       Other   Anxiety   Relevant Medications   escitalopram (LEXAPRO) 10 MG tablet   Other Visit Diagnoses     Patchy loss of hair    -  Primary   Relevant Orders   Ambulatory referral  to Dermatology      Increase Lexapro to 10 mg and follow up in 3 months. Encouraged her to find a therapist.  Referral to dermatologist. Hx of same hair loss with steroid injections.  Reviewed labs which were unremarkable.   I have discontinued Krystal Moreno's escitalopram. I am also having her start on escitalopram.  Meds ordered this encounter  Medications   escitalopram (LEXAPRO) 10 MG tablet    Sig: Take 1 tablet (10 mg total) by mouth daily.    Dispense:  30 tablet    Refill:  2    Order Specific Question:   Supervising Provider    Answer:   Hillard Danker A [[id number]]    I discussed the assessment and treatment plan with the patient. The patient was provided an opportunity to ask questions and all were answered. The patient agreed with the plan and demonstrated an understanding of the instructions.   The patient was advised to call back or seek an in-person evaluation if the symptoms worsen or if the condition fails to improve as anticipated.     Hetty Blend, NP-C [facility] [place] [phone] (phone) [phone] (fax)  [facility]

## 2023-05-09 DIAGNOSIS — R070 Pain in throat: Secondary | ICD-10-CM | POA: Diagnosis not present

## 2023-05-09 DIAGNOSIS — J029 Acute pharyngitis, unspecified: Secondary | ICD-10-CM | POA: Diagnosis not present

## 2023-07-20 DIAGNOSIS — Z3202 Encounter for pregnancy test, result negative: Secondary | ICD-10-CM | POA: Diagnosis not present

## 2023-07-20 DIAGNOSIS — Z6824 Body mass index (BMI) 24.0-24.9, adult: Secondary | ICD-10-CM | POA: Diagnosis not present

## 2023-07-20 DIAGNOSIS — Z01419 Encounter for gynecological examination (general) (routine) without abnormal findings: Secondary | ICD-10-CM | POA: Diagnosis not present

## 2023-08-08 DIAGNOSIS — Z9889 Other specified postprocedural states: Secondary | ICD-10-CM | POA: Diagnosis not present

## 2023-09-06 DIAGNOSIS — N760 Acute vaginitis: Secondary | ICD-10-CM | POA: Diagnosis not present

## 2023-09-06 DIAGNOSIS — N7689 Other specified inflammation of vagina and vulva: Secondary | ICD-10-CM | POA: Diagnosis not present

## 2023-09-06 DIAGNOSIS — N926 Irregular menstruation, unspecified: Secondary | ICD-10-CM | POA: Diagnosis not present

## 2023-09-19 IMAGING — DX DG CHEST 2V
2 series · 2 of 2 positions shown · non-contrast
Comparison: None Available.

CLINICAL DATA: Cough worsening over the past 5 weeks.

EXAM:
CHEST - 2 VIEW

[chest pa]
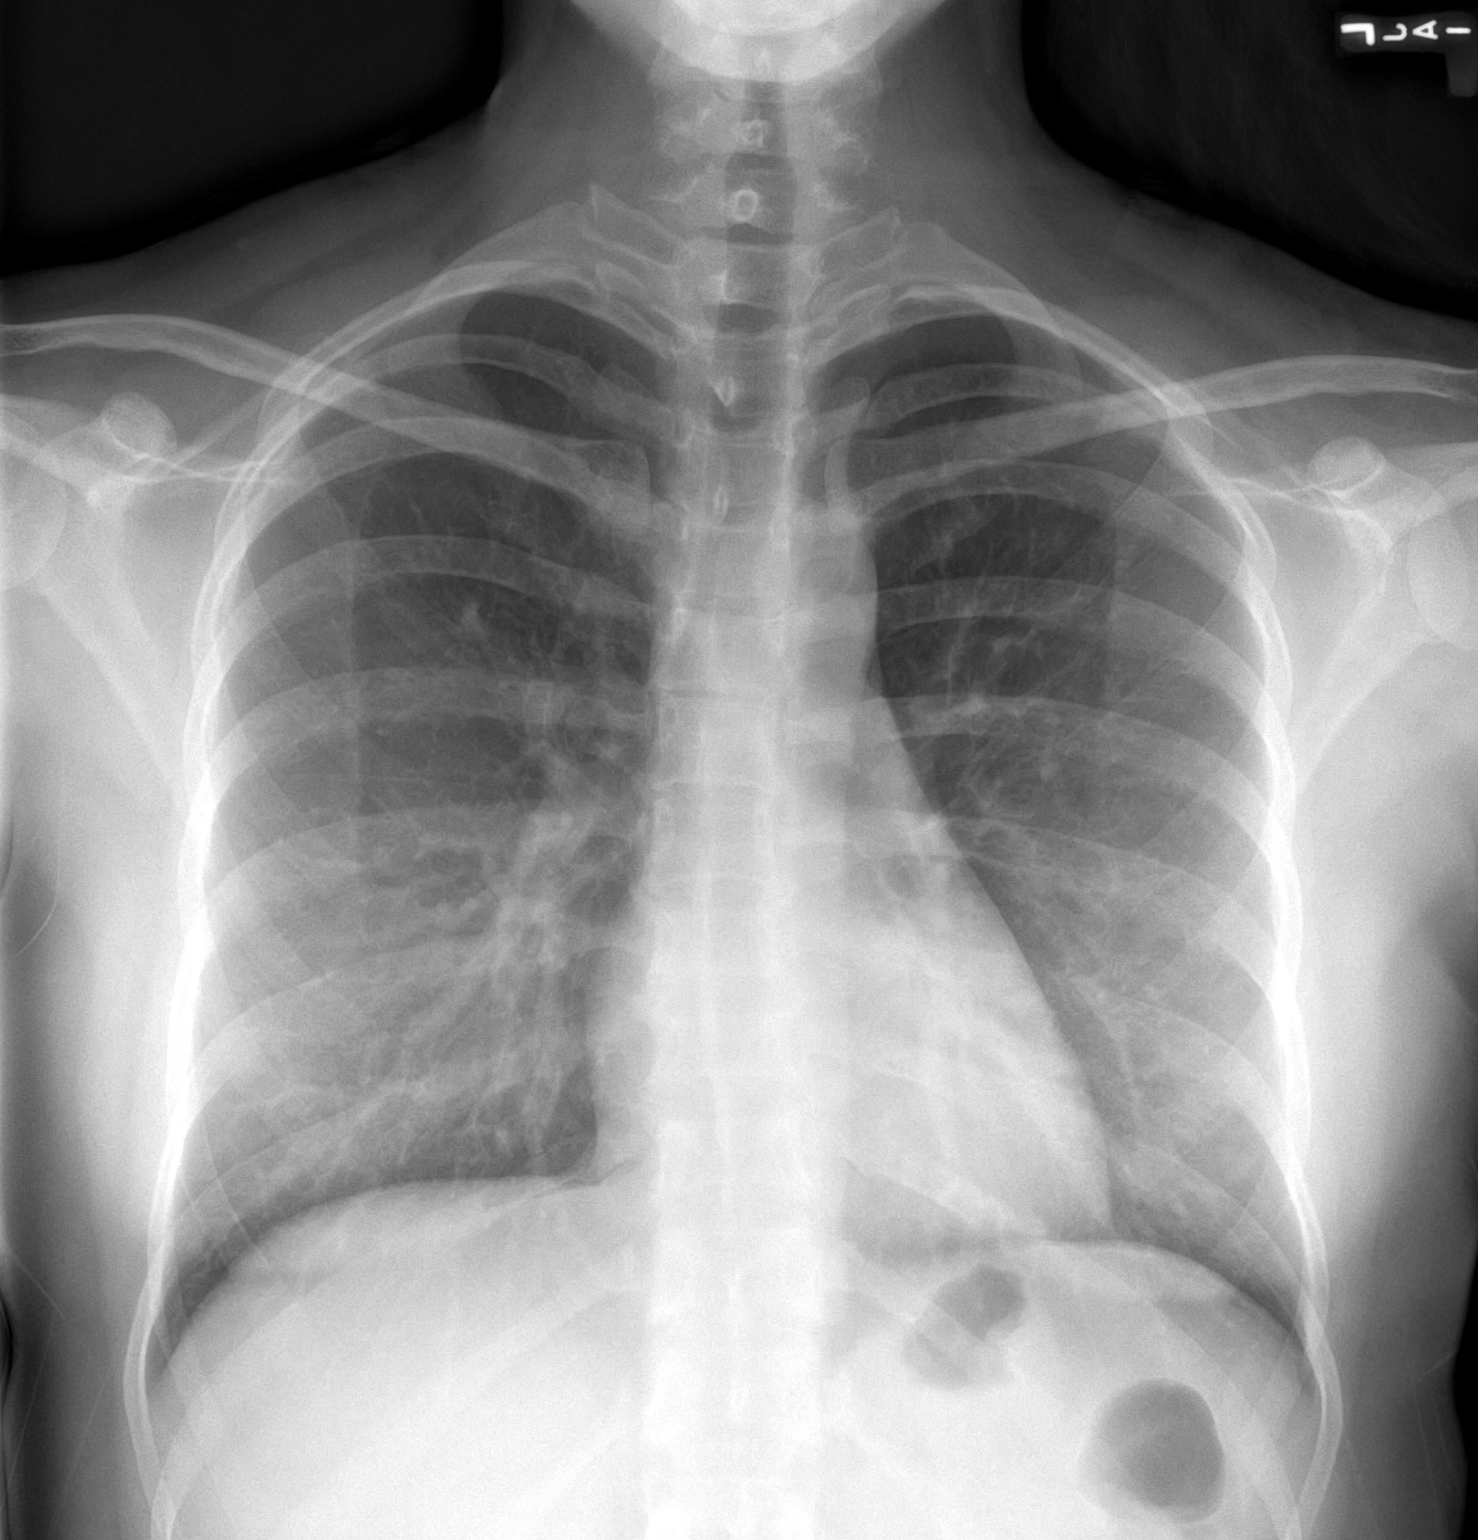

[chest lat]
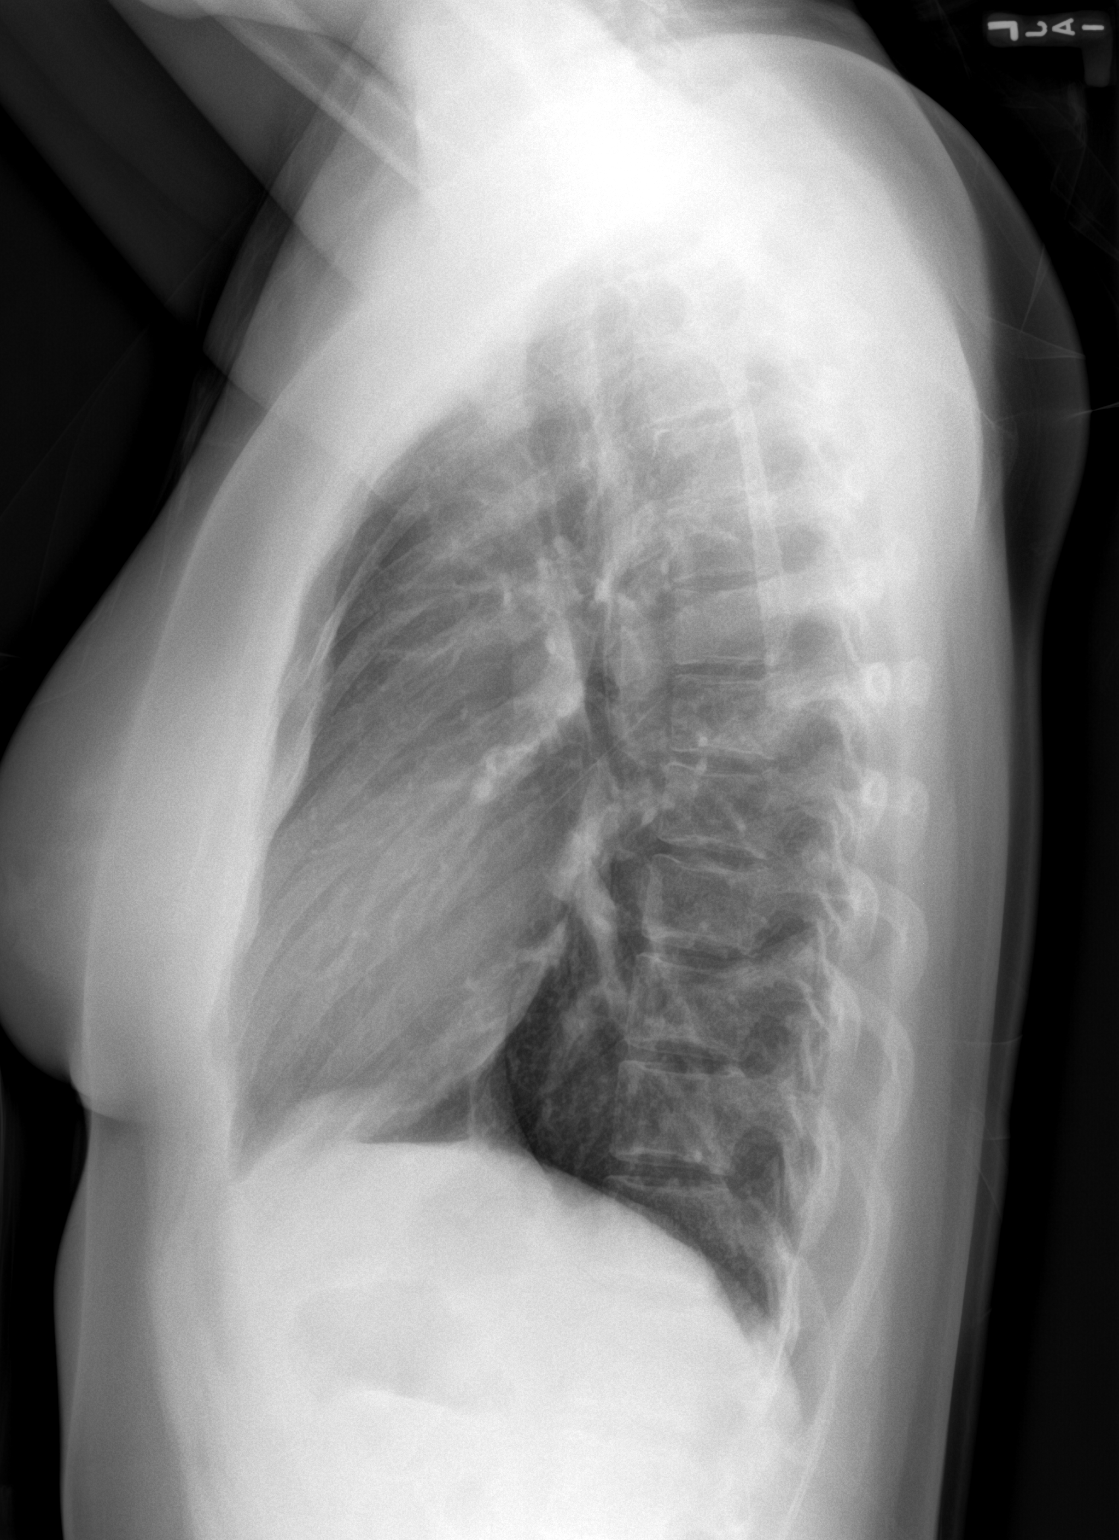

[2 of 2 positions shown; findings below may reference images not displayed]

FINDINGS: The heart size and mediastinal contours are within normal limits.
Both lungs are clear. The visualized skeletal structures are
unremarkable.
IMPRESSION: No active cardiopulmonary disease.

## 2023-11-02 DIAGNOSIS — N7689 Other specified inflammation of vagina and vulva: Secondary | ICD-10-CM | POA: Diagnosis not present
